# Patient Record
Sex: Female | Born: 1970 | Race: White | Hispanic: No | Marital: Married | Smoking: Never smoker
Health system: Southern US, Community
[De-identification: ages and names within clinical notes are randomized; demographics above are authoritative.]

## PROBLEM LIST (undated history)

## (undated) DIAGNOSIS — I10 Essential (primary) hypertension: Secondary | ICD-10-CM

## (undated) DIAGNOSIS — T753XXA Motion sickness, initial encounter: Secondary | ICD-10-CM

## (undated) DIAGNOSIS — Z973 Presence of spectacles and contact lenses: Secondary | ICD-10-CM

## (undated) DIAGNOSIS — Z9889 Other specified postprocedural states: Secondary | ICD-10-CM

## (undated) DIAGNOSIS — F988 Other specified behavioral and emotional disorders with onset usually occurring in childhood and adolescence: Secondary | ICD-10-CM

## (undated) DIAGNOSIS — E669 Obesity, unspecified: Secondary | ICD-10-CM

## (undated) DIAGNOSIS — G479 Sleep disorder, unspecified: Secondary | ICD-10-CM

## (undated) DIAGNOSIS — E785 Hyperlipidemia, unspecified: Secondary | ICD-10-CM

## (undated) DIAGNOSIS — R112 Nausea with vomiting, unspecified: Secondary | ICD-10-CM

## (undated) HISTORY — DX: Other specified behavioral and emotional disorders with onset usually occurring in childhood and adolescence: F98.8

## (undated) HISTORY — DX: Sleep disorder, unspecified: G47.9

## (undated) HISTORY — DX: Essential (primary) hypertension: I10

## (undated) HISTORY — DX: Hyperlipidemia, unspecified: E78.5

## (undated) HISTORY — DX: Obesity, unspecified: E66.9

---

## 1999-06-15 HISTORY — PX: DILATION AND CURETTAGE OF UTERUS: SHX78

## 2015-01-21 ENCOUNTER — Encounter: Payer: Self-pay | Admitting: *Deleted

## 2015-01-29 ENCOUNTER — Other Ambulatory Visit: Payer: Self-pay | Admitting: *Deleted

## 2015-01-29 ENCOUNTER — Other Ambulatory Visit: Payer: Self-pay | Admitting: Obstetrics and Gynecology

## 2015-01-29 ENCOUNTER — Encounter: Payer: Self-pay | Admitting: Obstetrics and Gynecology

## 2015-01-29 ENCOUNTER — Ambulatory Visit (INDEPENDENT_AMBULATORY_CARE_PROVIDER_SITE_OTHER): Payer: BLUE CROSS/BLUE SHIELD | Admitting: Obstetrics and Gynecology

## 2015-01-29 VITALS — BP 121/89 | HR 70 | Ht 65.0 in | Wt 179.0 lb

## 2015-01-29 DIAGNOSIS — E559 Vitamin D deficiency, unspecified: Secondary | ICD-10-CM

## 2015-01-29 DIAGNOSIS — E785 Hyperlipidemia, unspecified: Secondary | ICD-10-CM

## 2015-01-29 DIAGNOSIS — Z01419 Encounter for gynecological examination (general) (routine) without abnormal findings: Secondary | ICD-10-CM | POA: Diagnosis not present

## 2015-01-29 MED ORDER — VITAMIN D (ERGOCALCIFEROL) 1.25 MG (50000 UNIT) PO CAPS: 50000.0000 [IU] | ORAL_CAPSULE | ORAL | Status: DC

## 2015-01-29 MED ORDER — ALPRAZOLAM 0.5 MG PO TABS
0.5000 mg | ORAL_TABLET | Freq: Three times a day (TID) | ORAL | Status: DC | PRN
Start: 2015-01-29 — End: 2018-08-17

## 2015-01-29 MED ORDER — LISINOPRIL 20 MG PO TABS: 20.0000 mg | ORAL_TABLET | Freq: Every day | ORAL | Status: DC

## 2015-01-29 NOTE — Progress Notes (Signed)
  Subjective:     Carla Oliver is a 44 y.o. female and is here for a comprehensive physical exam. The patient reports no problems.  Social History   Social History  . Marital Status: Married    Spouse Name: N/A  . Number of Children: N/A  . Years of Education: N/A   Occupational History  . Not on file.   Social History Main Topics  . Smoking status: Never Smoker   . Smokeless tobacco: Never Used  . Alcohol Use: Yes  . Drug Use: No  . Sexual Activity: Yes    Birth Control/ Protection: Condom   Other Topics Concern  . Not on file   Social History Narrative   Health Maintenance  Topic Date Due  . HIV Screening  05/29/1984  . PAP SMEAR  05/30/1987  . TETANUS/TDAP  05/29/1988  . INFLUENZA VACCINE  01/13/2015    The following portions of the patient's history were reviewed and updated as appropriate: allergies, current medications, past family history, past medical history, past social history, past surgical history and problem list.  Review of Systems A comprehensive review of systems was negative.   Objective:    General appearance: alert, cooperative, appears stated age and no distress Neck: no adenopathy, no carotid bruit, no JVD, supple, symmetrical, trachea midline and thyroid not enlarged, symmetric, no tenderness/mass/nodules Lungs: clear to auscultation bilaterally Breasts: normal appearance, no masses or tenderness Heart: regular rate and rhythm, S1, S2 normal, no murmur, click, rub or gallop Abdomen: soft, non-tender; bowel sounds normal; no masses,  no organomegaly Pelvic: cervix normal in appearance, external genitalia normal, no adnexal masses or tenderness, no cervical motion tenderness, rectovaginal septum normal, uterus normal size, shape, and consistency and vagina normal without discharge    Assessment:    Healthy female exam. Hypertension; Vitamin d deficiency; hyperlipidemia; overweight; ADD; occasional sleep disturbance      Plan:  Routine pap  obtained with screening labs, MMG ordered; recommended 800mg  Vit E for breast fullness; refilled xanax and Vit D and Lisionopril. RTC 1 year or prn   See After Visit Summary for Counseling Recommendations

## 2015-01-29 NOTE — Patient Instructions (Signed)
  Place annual gynecologic exam patient instructions here.  Thank you for enrolling in [facility]. Please follow the instructions below to securely access your online medical record. MyChart allows you to send messages to your doctor, view your test results, manage appointments, and more.   How Do I Sign Up? 1. In your Internet browser, go to [employer] and enter [email or web address]. 2. Click on the Sign Up Now link in the Sign In box. You will see the New Member Sign Up page. 3. Enter your MyChart Access Code exactly as it appears below. You will not need to use this code after you've completed the sign-up process. If you do not sign up before the expiration date, you must request a new code.  MyChart Access Code: [email or web address] Expires: 03/30/2015  9:16 AM  4. Enter your Social Security Number ([id number]) and Date of Birth (mm/dd/yyyy) as indicated and click Submit. You will be taken to the next sign-up page. 5. Create a MyChart ID. This will be your MyChart login ID and cannot be changed, so think of one that is secure and easy to remember. 6. Create a MyChart password. You can change your password at any time. 7. Enter your Password Reset Question and Answer. This can be used at a later time if you forget your password.  8. Enter your e-mail address. You will receive e-mail notification when new information is available in [facility]. 9. Click Sign Up. You can now view your medical record.   Additional Information Remember, MyChart is NOT to be used for urgent needs. For medical emergencies, dial 911.

## 2015-01-30 ENCOUNTER — Other Ambulatory Visit: Payer: Self-pay | Admitting: Obstetrics and Gynecology

## 2015-01-30 DIAGNOSIS — E782 Mixed hyperlipidemia: Secondary | ICD-10-CM | POA: Insufficient documentation

## 2015-01-30 LAB — COMPREHENSIVE METABOLIC PANEL
A/G RATIO: 1.8 (ref 1.1–2.5)
ALT: 23 IU/L (ref 0–32)
AST: 18 IU/L (ref 0–40)
Albumin: 4.4 g/dL (ref 3.5–5.5)
Alkaline Phosphatase: 57 IU/L (ref 39–117)
BUN/Creatinine Ratio: 11 (ref 9–23)
BUN: 9 mg/dL (ref 6–24)
Bilirubin Total: 0.6 mg/dL (ref 0.0–1.2)
CALCIUM: 9.2 mg/dL (ref 8.7–10.2)
CO2: 23 mmol/L (ref 18–29)
CREATININE: 0.79 mg/dL (ref 0.57–1.00)
Chloride: 100 mmol/L (ref 97–108)
GFR, EST AFRICAN AMERICAN: 105 mL/min/{1.73_m2} (ref >59)
GFR, EST NON AFRICAN AMERICAN: 91 mL/min/{1.73_m2} (ref >59)
GLUCOSE: 99 mg/dL (ref 65–99)
Globulin, Total: 2.4 g/dL (ref 1.5–4.5)
Potassium: 4.2 mmol/L (ref 3.5–5.2)
Sodium: 142 mmol/L (ref 134–144)
TOTAL PROTEIN: 6.8 g/dL (ref 6.0–8.5)

## 2015-01-30 LAB — NMR, LIPOPROFILE
CHOLESTEROL: 243 mg/dL — AB (ref 100–199)
HDL CHOLESTEROL BY NMR: 53 mg/dL (ref >39)
HDL PARTICLE NUMBER: 33.4 umol/L (ref >=30.5)
LDL Particle Number: 1935 nmol/L — ABNORMAL HIGH (ref <1000)
LDL Size: 21.2 nm (ref >20.5)
LDL-C: 152 mg/dL — AB (ref 0–99)
LP-IR Score: 35 (ref <=45)
SMALL LDL PARTICLE NUMBER: 771 nmol/L — AB (ref <=527)
TRIGLYCERIDES BY NMR: 190 mg/dL — AB (ref 0–149)

## 2015-01-30 LAB — VITAMIN D 25 HYDROXY (VIT D DEFICIENCY, FRACTURES): VIT D 25 HYDROXY: 35.7 ng/mL (ref 30.0–100.0)

## 2015-01-30 MED ORDER — GARLIC 1000 MG PO CAPS: 1.0000 | ORAL_CAPSULE | Freq: Every morning | ORAL | Status: DC

## 2015-01-30 MED ORDER — RED YEAST RICE 600 MG PO TABS: 1.0000 | ORAL_TABLET | Freq: Every morning | ORAL | Status: DC

## 2015-01-30 MED ORDER — ATORVASTATIN CALCIUM 20 MG PO TABS: 20.0000 mg | ORAL_TABLET | Freq: Every day | ORAL | Status: DC

## 2015-01-30 MED ORDER — OMEGA-3 1000 MG PO CAPS: 1.0000 | ORAL_CAPSULE | Freq: Every morning | ORAL | Status: DC

## 2015-01-31 LAB — CYTOLOGY - PAP

## 2015-02-03 ENCOUNTER — Telehealth: Payer: Self-pay | Admitting: *Deleted

## 2015-02-03 NOTE — Telephone Encounter (Signed)
Notified pt of results, pt is going to get real serious with diet and exercise, doesn't want to start medication, will repeat labs in 3 months

## 2015-02-03 NOTE — Telephone Encounter (Signed)
-----   Message from Evonnie Pat, [place] sent at 01/30/2015 11:56 AM EDT ----- Please let her know her vit D is just now in normal range- I want her to continue with weekly supplement as we discussed. Also her cholesterol panel was worse, I sent in rx for Lipitor and garlic tablets, red rice yeast tablets, and Omega-3 tablets, to take all 4 daily to lower cholesterol, and want to recheck levels in 3 months.  Also continue to work on lowering cholesterol intake and regular exercise.  Part of her lipidemia is lifestyle but also shows hereditary factors.

## 2015-02-04 ENCOUNTER — Encounter: Payer: Self-pay | Admitting: *Deleted

## 2015-02-18 ENCOUNTER — Telehealth: Payer: Self-pay | Admitting: Obstetrics and Gynecology

## 2015-02-18 NOTE — Telephone Encounter (Signed)
Patient called stating her insurance is denying her lab done for cholesterol as it was not medically necessary. It looked like to me that it was coded correctly so Im not sure what to do.

## 2015-02-19 ENCOUNTER — Encounter: Payer: Self-pay | Admitting: Obstetrics and Gynecology

## 2015-02-19 ENCOUNTER — Ambulatory Visit (INDEPENDENT_AMBULATORY_CARE_PROVIDER_SITE_OTHER): Payer: BLUE CROSS/BLUE SHIELD | Admitting: Obstetrics and Gynecology

## 2015-02-19 VITALS — BP 134/91 | HR 78 | Ht 66.0 in | Wt 178.2 lb

## 2015-02-19 DIAGNOSIS — N764 Abscess of vulva: Secondary | ICD-10-CM

## 2015-02-19 NOTE — Telephone Encounter (Signed)
She will need to appeal it with her insurance, she also can contact [facility] and see if they will discount bill.

## 2015-02-19 NOTE — Progress Notes (Signed)
Subjective:     Patient ID: Carla Oliver, female   DOB: 03-10-1971, 44 y.o.   MRN: [id number]  HPI Felt a small bump on right vulva x 1 week   Review of Systems Painful at times, initially size of a green pea, now smaller- has been doing hot soaks and size has decreased, denies drainage or bleeding from it.    Objective:   Physical Exam A&O x4 Well groomed, no distress Pelvic exam: normal external genitalia, vulva, vagina, cervix, uterus and adnexa, VULVA: vulvar tenderness midline of minora and majora junction where vulvar nodule is located, movable and <70mm, no drainage noted VAGINA: normal appearing vagina with normal color and discharge, no lesions.    Assessment:     Vulvar cyst-right     Plan:     Epsom salt soaks tid until cyst resolved RTC if worsens  Gennie Alma, CNM

## 2015-02-21 NOTE — Telephone Encounter (Signed)
I added a couple dx codes E78.89 and E78.6 to see if it can get paid by insurance.  I notified patient. We will have to wait and see if her insurance will cover it with the added codes.

## 2015-02-26 ENCOUNTER — Ambulatory Visit: Payer: BLUE CROSS/BLUE SHIELD | Admitting: Obstetrics and Gynecology

## 2015-03-06 ENCOUNTER — Other Ambulatory Visit: Payer: BLUE CROSS/BLUE SHIELD

## 2015-03-06 ENCOUNTER — Ambulatory Visit (INDEPENDENT_AMBULATORY_CARE_PROVIDER_SITE_OTHER): Payer: BLUE CROSS/BLUE SHIELD | Admitting: Family Medicine

## 2015-03-06 ENCOUNTER — Encounter: Payer: Self-pay | Admitting: Family Medicine

## 2015-03-06 ENCOUNTER — Other Ambulatory Visit: Payer: Self-pay | Admitting: *Deleted

## 2015-03-06 VITALS — BP 132/86 | HR 103 | Temp 99.2°F | Resp 18 | Ht 66.0 in | Wt 178.0 lb

## 2015-03-06 DIAGNOSIS — F900 Attention-deficit hyperactivity disorder, predominantly inattentive type: Secondary | ICD-10-CM | POA: Diagnosis not present

## 2015-03-06 DIAGNOSIS — Z2839 Other underimmunization status: Secondary | ICD-10-CM

## 2015-03-06 DIAGNOSIS — Z9189 Other specified personal risk factors, not elsewhere classified: Secondary | ICD-10-CM

## 2015-03-06 DIAGNOSIS — Z111 Encounter for screening for respiratory tuberculosis: Secondary | ICD-10-CM

## 2015-03-06 DIAGNOSIS — I10 Essential (primary) hypertension: Secondary | ICD-10-CM | POA: Insufficient documentation

## 2015-03-06 DIAGNOSIS — Z9289 Personal history of other medical treatment: Secondary | ICD-10-CM | POA: Diagnosis not present

## 2015-03-06 DIAGNOSIS — G4700 Insomnia, unspecified: Secondary | ICD-10-CM | POA: Insufficient documentation

## 2015-03-06 DIAGNOSIS — F988 Other specified behavioral and emotional disorders with onset usually occurring in childhood and adolescence: Secondary | ICD-10-CM

## 2015-03-06 DIAGNOSIS — K5901 Slow transit constipation: Secondary | ICD-10-CM | POA: Insufficient documentation

## 2015-03-06 DIAGNOSIS — F4322 Adjustment disorder with anxiety: Secondary | ICD-10-CM | POA: Insufficient documentation

## 2015-03-06 MED ORDER — LISDEXAMFETAMINE DIMESYLATE 40 MG PO CAPS: 40.0000 mg | ORAL_CAPSULE | ORAL | Status: DC

## 2015-03-06 NOTE — Progress Notes (Signed)
Name: Carla Oliver   MRN: [id number]    DOB: [birth year]   Date:03/06/2015       Progress Note  Subjective  Chief Complaint  Chief Complaint  Patient presents with  . Medication Refill    vyvanse  . Ear Problem    red and hot    HPI  Attention Deficit Hyperactivity Disorder follow up: Carla Oliver is here for routine follow up of the diagnosis of ADD/ADHD. The condition is characterized as fidgeting, loses items necessary for activity, interrupting others, poor attention span, shifting from one uncompleted task to another and easily distracted but not excessive talking, difficulty remaining seated, difficulty waiting for their turn, engages in physically dangerous activities, difficulty waiting for their turn, blurting out answers before question is complete, difficulty following instructions or antisocial behavior. There has been no associated hearing difficulties, vision disturbances, difficulty speaking or sleeping. The patient currently uses Vyvanse 40 mg daily (took a break over the Summer) and reports no side effects from this medication including chest pain, worsening headaches, suppressed appetite and continued weight loss, mood changes, sleep changes.  She currently works as a Glass blower/designer at [employer] but needs form filled out to work at another public facility. She needs a PPD and does not have her immunization records with her.  Active Ambulatory Problems    Diagnosis Date Noted  . Vitamin D deficiency 01/29/2015  . Hyperlipemia, mixed 01/30/2015  . Attention deficit disorder of adult 03/06/2015  . Hypertension goal BP (blood pressure) < 140/90 03/06/2015  . Adjustment disorder with anxious mood 03/06/2015  . Insomnia 03/06/2015  . Constipation, slow transit 03/06/2015  . Incomplete immunization status 03/06/2015   Resolved Ambulatory Problems    Diagnosis Date Noted  . No Resolved Ambulatory Problems   Past Medical History  Diagnosis Date  . Hypertension   . Obesity   .  ADD (attention deficit disorder)   . Hyperlipidemia   . Sleep disturbance     Social History  Substance Use Topics  . Smoking status: Never Smoker   . Smokeless tobacco: Never Used  . Alcohol Use: Yes     Current outpatient prescriptions:  .  lisinopril (PRINIVIL,ZESTRIL) 20 MG tablet, Take 1 tablet (20 mg total) by mouth daily., Disp: 90 tablet, Rfl: 4 .  VYVANSE 40 MG capsule, Take 1 capsule by mouth daily., Disp: , Rfl: 0 .  ALPRAZolam (XANAX) 0.5 MG tablet, Take 1 tablet (0.5 mg total) by mouth 3 (three) times daily as needed for anxiety. (Patient not taking: Reported on 03/06/2015), Disp: 40 tablet, Rfl: 1  Past Surgical History  Procedure Laterality Date  . Dilation and curettage of uterus  2001    Family History  Problem Relation Age of Onset  . Cancer Mother     lung- non smoker    No Known Allergies   Review of Systems  CONSTITUTIONAL: No significant weight changes, fever, chills, weakness or fatigue.  CARDIOVASCULAR: No chest pain, chest pressure or chest discomfort. No palpitations or edema.  RESPIRATORY: No shortness of breath, cough or sputum.  GASTROINTESTINAL: No anorexia, nausea, vomiting. No changes in bowel habits. No abdominal pain or blood.  NEUROLOGICAL: No headache, dizziness, syncope, paralysis, ataxia, numbness or tingling in the extremities. No memory changes. No change in bowel or bladder control.  PSYCHIATRIC: No change in mood. No change in sleep pattern.    Objective  BP 132/86 mmHg  Pulse 103  Temp(Src) 99.2 F (37.3 C) (Oral)  Resp 18  Ht 5'  6" (1.676 m)  Wt 178 lb (80.74 kg)  BMI 28.74 kg/m2  SpO2 97%  LMP 02/09/2015 (Exact Date) Body mass index is 28.74 kg/(m^2).   Physical Exam  Constitutional: Patient appears well-developed and well-nourished. In no distress.  HEENT:  - Head: Normocephalic and atraumatic.  - Ears: Bilateral TMs gray, no erythema or effusion - Nose: Nasal mucosa moist - Mouth/Throat: Oropharynx is clear  and moist. No tonsillar hypertrophy or erythema. No post nasal drainage.  - Eyes: Conjunctivae clear, EOM movements normal. PERRLA. No scleral icterus.  Neck: Normal range of motion. Neck supple. No JVD present. No thyromegaly present.  Cardiovascular: Normal rate, regular rhythm and normal heart sounds.  No murmur heard.  Pulmonary/Chest: Effort normal and breath sounds normal. No respiratory distress. Abdomen: Soft, non tender, non distended, normal bowel sounds in all four quadrants.  Neurological: CN II-XII grossly intact with no focal deficits. Alert and oriented to person, place, and time. Coordination, balance, strength, speech and gait are normal.  Psychiatric: Patient has a stable mood and affect. Behavior is normal in office today. Judgment and thought content normal in office today.    Assessment & Plan  1. Attention deficit disorder of adult Doing well on current medication dose and frequency. No identified side effects or intolerances on today's exam. Continue current regimen as discussed and follow up routinely. Clinically stable findings based on clinical exam and on review of any pertinent results. Recommended to patient that they continue their current regimen with regular follow ups. May call in 3 months for 3 refills and follow up 6 months.  - lisdexamfetamine (VYVANSE) 40 MG capsule; Take 1 capsule (40 mg total) by mouth every morning.  Dispense: 40 capsule; Refill: 0 - lisdexamfetamine (VYVANSE) 40 MG capsule; Take 1 capsule (40 mg total) by mouth every morning.  Dispense: 30 capsule; Refill: 0 - lisdexamfetamine (VYVANSE) 40 MG capsule; Take 1 capsule (40 mg total) by mouth every morning.  Dispense: 30 capsule; Refill: 0  2. Incomplete immunization status Will check titer levels.  - Measles ([place]) Antibody IgG - Rubella Antibody, IgM - Mumps antibody, IgG - Varicella zoster antibody, IgG - Tetanus Antibody, IGG - Hepatitis B surface antibody

## 2015-03-10 LAB — QUANTIFERON IN TUBE
QUANTIFERON MITOGEN VALUE: 8.46 [IU]/mL
QUANTIFERON NIL VALUE: 0.18 [IU]/mL
QUANTIFERON TB AG VALUE: 0.16 IU/mL
QUANTIFERON TB GOLD: NEGATIVE

## 2015-03-10 LAB — QUANTIFERON TB GOLD ASSAY (BLOOD)

## 2015-04-08 ENCOUNTER — Other Ambulatory Visit: Payer: Self-pay | Admitting: Obstetrics and Gynecology

## 2015-08-22 ENCOUNTER — Encounter: Payer: Self-pay | Admitting: Emergency Medicine

## 2015-08-22 ENCOUNTER — Ambulatory Visit
Admission: EM | Admit: 2015-08-22 | Discharge: 2015-08-22 | Disposition: A | Discharge status: Home / Self Care | Payer: BLUE CROSS/BLUE SHIELD | Attending: Family Medicine | Admitting: Family Medicine

## 2015-08-22 DIAGNOSIS — J0191 Acute recurrent sinusitis, unspecified: Secondary | ICD-10-CM | POA: Diagnosis not present

## 2015-08-22 DIAGNOSIS — B349 Viral infection, unspecified: Secondary | ICD-10-CM

## 2015-08-22 LAB — RAPID INFLUENZA A&B ANTIGENS (ARMC ONLY): INFLUENZA A (ARMC): NEGATIVE

## 2015-08-22 LAB — RAPID STREP SCREEN (MED CTR MEBANE ONLY): STREPTOCOCCUS, GROUP A SCREEN (DIRECT): NEGATIVE

## 2015-08-22 LAB — RAPID INFLUENZA A&B ANTIGENS: Influenza B (ARMC): NEGATIVE

## 2015-08-22 MED ORDER — GUAIFENESIN-CODEINE 100-10 MG/5ML PO SOLN: 10.0000 mL | Freq: Every evening | ORAL | Status: DC | PRN

## 2015-08-22 MED ORDER — AMOXICILLIN-POT CLAVULANATE 875-125 MG PO TABS: 1.0000 | ORAL_TABLET | Freq: Two times a day (BID) | ORAL | Status: DC

## 2015-08-22 MED ORDER — OSELTAMIVIR PHOSPHATE 75 MG PO CAPS: 75.0000 mg | ORAL_CAPSULE | Freq: Two times a day (BID) | ORAL | Status: DC

## 2015-08-22 NOTE — ED Notes (Signed)
Patient c/o cough that has gotten worse since yesterday.  Patient reports cold symptoms off and on for a month.

## 2015-08-22 NOTE — ED Provider Notes (Signed)
[facility]  ____________________________________________  Time seen: Approximately 3:57 PM  I have reviewed the triage vital signs and the nursing notes.   HISTORY  Chief Complaint Cough  HPI [name] is a 45 y.o. female presents with a complaint of intermittent runny nose, nasal congestion and cough over the last several weeks. Patient reports that she has been treated for strep throat and sinus infections in the last couple weeks and states that things have gotten better but never fully resolved. Patient reports in the last 2 days her symptoms of cough, congestion have increased as well as she started to have some chills and body aches. Denies known fever, but states shes sure she had a low grade fever. States frequently getting thick greenish nasal drainage with blowing her nose. Also complains of sinus pressure and feels like her sinuses are clogged up.  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 Denies home sick contacts. Patient reports generalized body aches at 5 out of 10 aching.  Denies chest pain, shortness of breath, abdominal pain, neck pain, back pain, dysuria, vision changes, weakness or dizziness.  Patient's last menstrual period was 07/27/2015 (exact date). Denies chance of pregnancy.  Past Medical History  Diagnosis Date  . Hypertension   . Obesity   . ADD (attention deficit disorder)   . Hyperlipidemia   . Sleep disturbance     Patient Active Problem List   Diagnosis Date Noted  . Attention deficit disorder of adult 03/06/2015  . Hypertension goal BP (blood pressure) < 140/90 03/06/2015  . Adjustment disorder with anxious mood 03/06/2015  . Insomnia 03/06/2015  . Constipation, slow transit 03/06/2015  . Incomplete immunization status 03/06/2015  . Hyperlipemia, mixed 01/30/2015  . Vitamin D deficiency 01/29/2015    Past Surgical History  Procedure Laterality Date   . Dilation and curettage of uterus  2001    Current Outpatient Rx  Name  Route  Sig  Dispense  Refill  . ALPRAZolam (XANAX) 0.5 MG tablet   Oral   Take 1 tablet (0.5 mg total) by mouth 3 (three) times daily as needed for anxiety. Patient not taking: Reported on 03/06/2015   40 tablet   1   . lisdexamfetamine (VYVANSE) 40 MG capsule   Oral   Take 1 capsule (40 mg total) by mouth every morning.   40 capsule   0     Refill 04/05/15   . lisdexamfetamine (VYVANSE) 40 MG capsule   Oral   Take 1 capsule (40 mg total) by mouth every morning.   30 capsule   0     Refill 05/06/15   . lisdexamfetamine (VYVANSE) 40 MG capsule   Oral   Take 1 capsule (40 mg total) by mouth every morning.   30 capsule   0     Refill 03/06/15   . lisinopril (PRINIVIL,ZESTRIL) 20 MG tablet      TAKE 1 TABLET BY MOUTH DAILY   90 tablet   0   . VYVANSE 40 MG capsule   Oral   Take 1 capsule by mouth daily.      0     Dispense as written.     Allergies Review of patient's allergies indicates no known allergies.  Family History  Problem Relation Age of Onset  . Cancer Mother     lung- non smoker    Social History Social History  Substance Use Topics  .  Smoking status: Never Smoker   . Smokeless tobacco: Never Used  . Alcohol Use: Yes    Review of Systems Constitutional:Positive chills. Eyes: No visual changes. Ears, nose and throat : positive cough congestion nasal pressure. Positive mild sore throat.  Cardiovascular: Denies chest pain. Respiratory: Denies shortness of breath. Gastrointestinal: No abdominal pain.  No nausea, no vomiting.  No diarrhea.  No constipation. Genitourinary: Negative for dysuria. Musculoskeletal: Negative for back pain. Skin: Negative for rash. Neurological: Negative for headaches, focal weakness or numbness.  10-point ROS otherwise negative.  ____________________________________________   PHYSICAL EXAM:  VITAL SIGNS: ED Triage Vitals  Enc  Vitals Group     BP 08/22/15 1419 138/91 mmHg     Pulse Rate 08/22/15 1419 83     Resp 08/22/15 1419 16     Temp 08/22/15 1419 99.5 F (37.5 C)     Temp Source 08/22/15 1419 Tympanic     SpO2 08/22/15 1419 100 %     Weight 08/22/15 1419 175 lb (79.379 kg)     Height 08/22/15 1419 5\' 6"  (1.676 m)     Head Cir --      Peak Flow --      Pain Score 08/22/15 1422 5     Pain Loc --      Pain Edu? --      Excl. in [place]? --   Constitutional: Alert and oriented. Well appearing and in no acute distress. Eyes: Conjunctivae are normal. PERRL. EOMI. Head: Atraumatic.Mild to moderate tenderness to palpation bilateral frontal and maxillary sinuses. No swelling. No erythema.   Ears: no erythema, normal TMs bilaterally.   Nose: nasal congestion with bilateral nasal turbinate erythema and edema.   Mouth/Throat: Mucous membranes are moist.  Oropharynx non-erythematous.No tonsillar swelling or exudate.  Neck: No stridor.  No cervical spine tenderness to palpation. Hematological/Lymphatic/Immunilogical: No cervical lymphadenopathy. Cardiovascular: Normal rate, regular rhythm. Grossly normal heart sounds.  Good peripheral circulation. Respiratory: Normal respiratory effort.  No retractions. Lungs CTAB. No wheezes, rales or rhonchi. Good air movement.  Gastrointestinal: Soft and nontender. No distention. Normal Bowel sounds. No CVA tenderness. Musculoskeletal: No lower or upper extremity tenderness nor edema.  Bilateral pedal pulses equal and easily palpated. No cervical, thoracic or lumbar tenderness to palpation.  Neurologic:  Normal speech and language. No gross focal neurologic deficits are appreciated. No gait instability. Skin:  Skin is warm, dry and intact. No rash noted. Psychiatric: Mood and affect are normal. Speech and behavior are normal.  ____________________________________________   LABS (all labs ordered are listed, but only abnormal results are displayed)  Labs Reviewed  RAPID INFLUENZA  A&B ANTIGENS ([facility] ONLY)  RAPID STREP SCREEN (NOT AT [facility])  CULTURE, GROUP A STREP [facility])     INITIAL IMPRESSION / ASSESSMENT AND PLAN / ED COURSE  Pertinent labs & imaging results that were available during my care of the patient were reviewed by me and considered in my medical decision making (see chart for details). Well-appearing patient. No acute distress. Reports continues to eat and drink well. Presenting for the complaints of several weeks of intermittent runny nose, nasal congestion, sinus pressure and cough. Patient reports that she was treated with penicillin for strep throat approximately two months ago. Reports that her sinus congestion and sinus drainage has never fully resolved. Patient with continued sinus tenderness. Dry intermittent cough. Lungs clear throughout. Abdomen soft and nontender. Moist mucous membranes. Suspect sinusitis. 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 Patient symptoms  as mentioned has been going on for several weeks but with acute change in the last 2 days with accompanying body aches and report of fever.   Suspect continued sinusitis with postnasal drainage however with acute change in the last 2 days with positive influenza exposures, suspect influenza. Influenza negative. Strep negative, will culture. However based on clinical appearance as well as positive exposure suspect influenza. Discussed with patient treatment with oral Tamiflu. Patient requests prescription for Tamiflu.Discussed indication, risks and benefits of medications with patient. Will treat sinusitis with oral augmentin, prn guaifenesin with codeine. Encouraged rest, fluids, otc ibuprofen or tylenol as needed.   Discussed follow up with Primary care physician this week. Discussed follow up and return parameters including no resolution or any worsening concerns. Patient verbalized understanding and agreed to plan.    ____________________________________________   FINAL CLINICAL IMPRESSION(S) / ED DIAGNOSES  Final diagnoses:  Acute recurrent sinusitis, unspecified location  Viral illness      Note: This dictation was prepared with Dragon dictation along with smaller phrase technology. Any transcriptional errors that result from this process are unintentional.    Marylene Land, NP 08/22/15 1713

## 2015-08-22 NOTE — Discharge Instructions (Signed)
Take medication as prescribed. Rest. Drink plenty of fluids.   Follow up with your primary care physician this week as needed. Return to Urgent care for new or worsening concerns.    Sinusitis, Adult Sinusitis is redness, soreness, and inflammation of the paranasal sinuses. Paranasal sinuses are air pockets within the bones of your face. They are located beneath your eyes, in the middle of your forehead, and above your eyes. In healthy paranasal sinuses, mucus is able to drain out, and air is able to circulate through them by way of your nose. However, when your paranasal sinuses are inflamed, mucus and air can become trapped. This can allow bacteria and other germs to grow and cause infection. Sinusitis can develop quickly and last only a short time (acute) or continue over a long period (chronic). Sinusitis that lasts for more than 12 weeks is considered chronic. CAUSES Causes of sinusitis include:  Allergies.  Structural abnormalities, such as displacement of the cartilage that separates your nostrils (deviated septum), which can decrease the air flow through your nose and sinuses and affect sinus drainage.  Functional abnormalities, such as when the small hairs (cilia) that line your sinuses and help remove mucus do not work properly or are not present. SIGNS AND SYMPTOMS Symptoms of acute and chronic sinusitis are the same. The primary symptoms are pain and pressure around the affected sinuses. Other symptoms include:  Upper toothache.  Earache.  Headache.  Bad breath.  Decreased sense of smell and taste.  A cough, which worsens when you are lying flat.  Fatigue.  Fever.  Thick drainage from your nose, which often is green and may contain pus (purulent).  Swelling and warmth over the affected sinuses. DIAGNOSIS Your health care provider will perform a physical exam. During your exam, your health care provider may perform any of the following to help determine if you have  acute sinusitis or chronic sinusitis:  Look in your nose for signs of abnormal growths in your nostrils (nasal polyps).  Tap over the affected sinus to check for signs of infection.  View the inside of your sinuses using an imaging device that has a light attached (endoscope). If your health care provider suspects that you have chronic sinusitis, one or more of the following tests may be recommended:  Allergy tests.  Nasal culture. A sample of mucus is taken from your nose, sent to a lab, and screened for bacteria.  Nasal cytology. A sample of mucus is taken from your nose and examined by your health care provider to determine if your sinusitis is related to an allergy. TREATMENT Most cases of acute sinusitis are related to a viral infection and will resolve on their own within 10 days. Sometimes, medicines are prescribed to help relieve symptoms of both acute and chronic sinusitis. These may include pain medicines, decongestants, nasal steroid sprays, or saline sprays. However, for sinusitis related to a bacterial infection, your health care provider will prescribe antibiotic medicines. These are medicines that will help kill the bacteria causing the infection. Rarely, sinusitis is caused by a fungal infection. In these cases, your health care provider will prescribe antifungal medicine. For some cases of chronic sinusitis, surgery is needed. Generally, these are cases in which sinusitis recurs more than 3 times per year, despite other treatments. HOME CARE INSTRUCTIONS  Drink plenty of water. Water helps thin the mucus so your sinuses can drain more easily.  Use a humidifier.  Inhale steam 3-4 times a day (for example, sit in  the bathroom with the shower running).  Apply a warm, moist washcloth to your face 3-4 times a day, or as directed by your health care provider.  Use saline nasal sprays to help moisten and clean your sinuses.  Take medicines only as directed by your health care  provider.  If you were prescribed either an antibiotic or antifungal medicine, finish it all even if you start to feel better. SEEK IMMEDIATE MEDICAL CARE IF:  You have increasing pain or severe headaches.  You have nausea, vomiting, or drowsiness.  You have swelling around your face.  You have vision problems.  You have a stiff neck.  You have difficulty breathing.   This information is not intended to replace advice given to you by your health care provider. Make sure you discuss any questions you have with your health care provider.   Document Released: 05/31/2005 Document Revised: 06/21/2014 Document Reviewed: 06/15/2011 Elsevier Interactive Patient Education [employer].

## 2015-08-24 LAB — CULTURE, GROUP A STREP (THRC)

## 2015-08-25 ENCOUNTER — Telehealth: Payer: Self-pay

## 2015-08-25 NOTE — Telephone Encounter (Signed)
Agree with recommendation

## 2015-08-25 NOTE — Telephone Encounter (Signed)
FYI patient called states she went to urgent care on Friday was diagnosed with flu and sinusitis and was given tamiflu and antibiotic.  She was told to f/u with PCP.  She called today @ 3:00 stating she was now having some wheezing and congestion and wanted to be seen.  I told her you were booked for the rest of the day and we had already worked in someone.  I told her most likely we would do nothing different since she was currently on an antibioic and tamiflu already.  She said has never had pneumonia and wanted to know if she needed an inhaler.  I told her I could not guarantee the doctor would do anything different and to keep her appointment on Wednesday with Dr. Ancil Boozer , if she felt worse or felt she needed to be seen urgently to go back to urgent care or ER.  She asked if you had an e-mail and I told her she could e-mail you through my chart.  So you may receive an e-mail.

## 2015-08-27 ENCOUNTER — Encounter: Payer: Self-pay | Admitting: Family Medicine

## 2015-08-27 ENCOUNTER — Ambulatory Visit (INDEPENDENT_AMBULATORY_CARE_PROVIDER_SITE_OTHER): Payer: BLUE CROSS/BLUE SHIELD | Admitting: Family Medicine

## 2015-08-27 VITALS — BP 134/82 | HR 88 | Temp 98.9°F | Resp 18 | Ht 66.0 in | Wt 180.4 lb

## 2015-08-27 DIAGNOSIS — F900 Attention-deficit hyperactivity disorder, predominantly inattentive type: Secondary | ICD-10-CM

## 2015-08-27 DIAGNOSIS — J0101 Acute recurrent maxillary sinusitis: Secondary | ICD-10-CM | POA: Diagnosis not present

## 2015-08-27 DIAGNOSIS — F988 Other specified behavioral and emotional disorders with onset usually occurring in childhood and adolescence: Secondary | ICD-10-CM

## 2015-08-27 DIAGNOSIS — F458 Other somatoform disorders: Secondary | ICD-10-CM

## 2015-08-27 DIAGNOSIS — R0989 Other specified symptoms and signs involving the circulatory and respiratory systems: Secondary | ICD-10-CM

## 2015-08-27 DIAGNOSIS — R062 Wheezing: Secondary | ICD-10-CM

## 2015-08-27 DIAGNOSIS — B349 Viral infection, unspecified: Secondary | ICD-10-CM | POA: Diagnosis not present

## 2015-08-27 MED ORDER — LISDEXAMFETAMINE DIMESYLATE 40 MG PO CAPS: 40.0000 mg | ORAL_CAPSULE | ORAL | Status: DC

## 2015-08-27 MED ORDER — CODEINE POLT-CHLORPHEN POLT ER 14.7-2.8 MG/5ML PO SUER: 5.0000 mL | Freq: Two times a day (BID) | ORAL | Status: DC

## 2015-08-27 MED ORDER — ALBUTEROL SULFATE HFA 108 (90 BASE) MCG/ACT IN AERS: 2.0000 | INHALATION_SPRAY | Freq: Four times a day (QID) | RESPIRATORY_TRACT | Status: DC | PRN

## 2015-08-27 NOTE — Progress Notes (Signed)
Name: Carla Oliver   MRN: [id number]    DOB: May 09, 1971   Date:08/27/2015       Progress Note  Subjective  Chief Complaint  Chief Complaint  Patient presents with  . URI    Onset-Intermittent since January, Dr. Nadine Counts checked patient for Strep and was positive given Pencillin, got better and 2 weeks later came back. Then went to [facility] on Friday 08/22/15 tested for Flu was negative, but was given Tamiflu due to symptoms and Augmentin. Patient states she is feeling slightly better but achy headache, sinus pressure and painful around her eye sockets, bilateral-right ear is worst pain, dry cough, feels like she has a lump in her throat, and can't swallow.     HPI  Globus sensation and sinus pressure: recurrent sore throat, sinus pressure since January. Went on Urgent care twice, initially have Pen-V-K for strep and this past time diagnosed with sinusitis and flu like illness, current on Augmentin and Tamiflu. Sinus pressure has improve, no longer has fever, appetite is back to normal, she has some intermittent nausea. Wheezing yesterday, still has a cough ( did not get rx for cough medication filled because it is out of stock at her pharmacy ) and some SOB with activity.   Globus: started a couple months ago, she states pills gets stuck at times, also has this fullness sensation on her upper esophagus and throat, constant but with periods of exacerbation, no heartburn, no regurgitation. No family history of esophageal or throat cancer, no personal smoking history  ADD: diagnosed by counselor during her college years ( 28's) son also has ADD. She was on Ritalin int he past, but doing well now on Vyvanse and needs refills. No side effects of medication. She is able to focus and stays on tract while on medication   Patient Active Problem List   Diagnosis Date Noted  . Attention deficit disorder of adult 03/06/2015  . Hypertension goal BP (blood pressure) < 140/90 03/06/2015  .  Adjustment disorder with anxious mood 03/06/2015  . Insomnia 03/06/2015  . Constipation, slow transit 03/06/2015  . Incomplete immunization status 03/06/2015  . Hyperlipemia, mixed 01/30/2015  . Vitamin D deficiency 01/29/2015    Past Surgical History  Procedure Laterality Date  . Dilation and curettage of uterus  2001    Family History  Problem Relation Age of Onset  . Cancer Mother     lung- non smoker    Social History   Social History  . Marital Status: Married    Spouse Name: N/A  . Number of Children: N/A  . Years of Education: N/A   Occupational History  . Not on file.   Social History Main Topics  . Smoking status: Never Smoker   . Smokeless tobacco: Never Used  . Alcohol Use: Yes  . Drug Use: No  . Sexual Activity: Yes    Birth Control/ Protection: Condom   Other Topics Concern  . Not on file   Social History Narrative     Current outpatient prescriptions:  .  ALPRAZolam (XANAX) 0.5 MG tablet, Take 1 tablet (0.5 mg total) by mouth 3 (three) times daily as needed for anxiety., Disp: 40 tablet, Rfl: 1 .  amoxicillin-clavulanate (AUGMENTIN) 875-125 MG tablet, Take 1 tablet by mouth every 12 (twelve) hours., Disp: 20 tablet, Rfl: 0 .  lisdexamfetamine (VYVANSE) 40 MG capsule, Take 1 capsule (40 mg total) by mouth every morning., Disp: 40 capsule, Rfl: 0 .  lisdexamfetamine (VYVANSE) 40 MG capsule,  Take 1 capsule (40 mg total) by mouth every morning., Disp: 30 capsule, Rfl: 0 .  lisdexamfetamine (VYVANSE) 40 MG capsule, Take 1 capsule (40 mg total) by mouth every morning., Disp: 30 capsule, Rfl: 0 .  lisinopril (PRINIVIL,ZESTRIL) 20 MG tablet, TAKE 1 TABLET BY MOUTH DAILY, Disp: 90 tablet, Rfl: 0 .  oseltamivir (TAMIFLU) 75 MG capsule, Take 1 capsule (75 mg total) by mouth every 12 (twelve) hours., Disp: 10 capsule, Rfl: 0 .  TAMIFLU 75 MG capsule, , Disp: , Rfl: 0 .  Vitamin D, Ergocalciferol, (DRISDOL) 50000 units CAPS capsule, TK 1 C PO ONCE A WEEK, Disp:  , Rfl: 2 .  VYVANSE 40 MG capsule, Take 1 capsule by mouth daily., Disp: , Rfl: 0 .  albuterol (PROVENTIL HFA;VENTOLIN HFA) 108 (90 Base) MCG/ACT inhaler, Inhale 2 puffs into the lungs every 6 (six) hours as needed for wheezing or shortness of breath., Disp: 1 Inhaler, Rfl: 0 .  Codeine Polt-Chlorphen Polt ER (TUZISTRA XR) 14.7-2.8 MG/5ML SUER, Take 5 mLs by mouth 2 (two) times daily., Disp: 473 mL, Rfl: 0  No Known Allergies   ROS  Ten systems reviewed and is negative except as mentioned in HPI   Objective  Filed Vitals:   08/27/15 1223  BP: 134/82  Pulse: 88  Temp: 98.9 F (37.2 C)  TempSrc: Oral  Resp: 18  Height: 5\' 6"  (1.676 m)  Weight: 180 lb 6.4 oz (81.829 kg)  SpO2: 98%    Body mass index is 29.13 kg/(m^2).  Physical Exam  Constitutional: Patient appears well-developed and well-nourished. No distress.  HEENT: head atraumatic, normocephalic, pupils equal and reactive to light,, neck supple, throat within normal limits Cardiovascular: Normal rate, regular rhythm and normal heart sounds.  No murmur heard. No BLE edema. Pulmonary/Chest: Effort normal and breath sounds normal. No respiratory distress. Abdominal: Soft.  There is no tenderness. Psychiatric: Patient has a normal mood and affect. behavior is normal. Judgment and thought content normal.  Recent Results (from the past 2160 hour(s))  Rapid Influenza A&B Antigens ([facility] only)     Status: None   Collection Time: 08/22/15  3:31 PM  Result Value Ref Range   Influenza A ([facility]) NEGATIVE NEGATIVE   Influenza B ([facility]) NEGATIVE NEGATIVE  Rapid strep screen     Status: None   Collection Time: 08/22/15  3:31 PM  Result Value Ref Range   Streptococcus, Group A Screen (Direct) NEGATIVE NEGATIVE    Comment: (NOTE) A Rapid Antigen test may result negative if the antigen level in the sample is below the detection level of this test. The FDA has not cleared this test as a stand-alone test therefore the rapid  antigen negative result has reflexed to a Group A Strep culture.   Culture, group A strep     Status: None   Collection Time: 08/22/15  3:31 PM  Result Value Ref Range   Specimen Description THROAT    Special Requests NONE Reflexed from [id number]    Culture NO BETA HEMOLYTIC STREPTOCOCCI ISOLATED    Report Status 08/24/2015 FINAL     PHQ2/9: Depression screen [facility] 2/9 08/27/2015 03/06/2015  Decreased Interest 0 0  Down, Depressed, Hopeless 0 0  PHQ - 2 Score 0 0     Fall Risk: Fall Risk  08/27/2015 03/06/2015  Falls in the past year? No No      Functional Status Survey: Is the patient deaf or have difficulty hearing?: No Does the patient have difficulty seeing, even when wearing  glasses/contacts?: No Does the patient have difficulty concentrating, remembering, or making decisions?: No Does the patient have difficulty walking or climbing stairs?: No Does the patient have difficulty dressing or bathing?: No Does the patient have difficulty doing errands alone such as visiting a doctor's office or shopping?: No    Assessment & Plan  1. Globus sensation  - Ambulatory referral to ENT  2. Recurrent maxillary sinusitis, unspecified chronicity  - Ambulatory referral to ENT  3. Wheezing  - albuterol (PROVENTIL HFA;VENTOLIN HFA) 108 (90 Base) MCG/ACT inhaler; Inhale 2 puffs into the lungs every 6 (six) hours as needed for wheezing or shortness of breath.  Dispense: 1 Inhaler; Refill: 0  4. Viral illness  - Codeine Polt-Chlorphen Polt ER (TUZISTRA XR) 14.7-2.8 MG/5ML SUER; Take 5 mLs by mouth 2 (two) times daily.  Dispense: 473 mL; Refill: 0   5. Attention deficit disorder of adult  - lisdexamfetamine (VYVANSE) 40 MG capsule; Take 1 capsule (40 mg total) by mouth every morning.  Dispense: 30 capsule; Refill: 0 - lisdexamfetamine (VYVANSE) 40 MG capsule; Take 1 capsule (40 mg total) by mouth every morning.  Dispense: 40 capsule; Refill: 0 - lisdexamfetamine (VYVANSE) 40 MG  capsule; Take 1 capsule (40 mg total) by mouth every morning.  Dispense: 30 capsule; Refill: 0

## 2016-02-03 ENCOUNTER — Encounter: Payer: BLUE CROSS/BLUE SHIELD | Admitting: Obstetrics and Gynecology

## 2016-02-12 ENCOUNTER — Encounter: Payer: BLUE CROSS/BLUE SHIELD | Admitting: Obstetrics and Gynecology

## 2016-04-09 ENCOUNTER — Other Ambulatory Visit: Payer: Self-pay | Admitting: Obstetrics and Gynecology

## 2016-05-05 ENCOUNTER — Encounter: Payer: Self-pay | Admitting: Obstetrics and Gynecology

## 2016-05-05 ENCOUNTER — Ambulatory Visit (INDEPENDENT_AMBULATORY_CARE_PROVIDER_SITE_OTHER): Payer: BLUE CROSS/BLUE SHIELD | Admitting: Obstetrics and Gynecology

## 2016-05-05 VITALS — BP 114/80 | HR 71 | Ht 66.0 in | Wt 178.3 lb

## 2016-05-05 DIAGNOSIS — Z01419 Encounter for gynecological examination (general) (routine) without abnormal findings: Secondary | ICD-10-CM | POA: Diagnosis not present

## 2016-05-05 DIAGNOSIS — F988 Other specified behavioral and emotional disorders with onset usually occurring in childhood and adolescence: Secondary | ICD-10-CM

## 2016-05-05 DIAGNOSIS — E559 Vitamin D deficiency, unspecified: Secondary | ICD-10-CM | POA: Diagnosis not present

## 2016-05-05 DIAGNOSIS — Z23 Encounter for immunization: Secondary | ICD-10-CM

## 2016-05-05 MED ORDER — VITAMIN D (ERGOCALCIFEROL) 1.25 MG (50000 UNIT) PO CAPS: ORAL_CAPSULE | ORAL | 2 refills | Status: DC

## 2016-05-05 MED ORDER — LISDEXAMFETAMINE DIMESYLATE 40 MG PO CAPS: 40.0000 mg | ORAL_CAPSULE | ORAL | 0 refills | Status: DC

## 2016-05-05 MED ORDER — LISINOPRIL 20 MG PO TABS: 20.0000 mg | ORAL_TABLET | Freq: Every day | ORAL | 4 refills | Status: DC

## 2016-05-05 NOTE — Patient Instructions (Signed)
Thank you for enrolling in [place]. Please follow the instructions below to securely access your online medical record. MyChart allows you to send messages to your doctor, view your test results, renew your prescriptions, schedule appointments, and more.  How Do I Sign Up? 1. In your Internet browser, go to [email or web address]. 2. Click on the New  User? link in the Sign In box.  3. Enter your MyChart Access Code exactly as it appears below. You will not need to use this code after you have completed the sign-up process. If you do not sign up before the expiration date, you must request a new code. MyChart Access Code: [id number] Expires: 07/04/2016  8:03 AM  4. Enter the last four digits of your Social Security Number (xxxx) and Date of Birth (mm/dd/yyyy) as indicated and click Next. You will be taken to the next sign-up page. 5. Create a MyChart ID. This will be your MyChart login ID and cannot be changed, so think of one that is secure and easy to remember. 6. Create a MyChart password. You can change your password at any time. 7. Enter your Password Reset Question and Answer and click Next. This can be used at a later time if you forget your password.  8. Select your communication preference, and if applicable enter your e-mail address. You will receive e-mail notification when new information is available in MyChart by choosing to receive e-mail notifications and filling in your e-mail. 9. Click Sign In. You can now view your medical record.   Additional Information If you have questions, you can email [email or web address] or call [phone] to talk to our [place] staff. Remember, MyChart is NOT to be used for urgent needs. For medical emergencies, dial 911.

## 2016-05-05 NOTE — Progress Notes (Signed)
Subjective:   Carla Oliver is a 45 y.o. G67P0010 Caucasian female here for a routine well-woman exam.  Patient's last menstrual period was 04/12/2016.    Current complaints: irregular menses x 6 months occurring every 2 to 6 weeks. Feels mentally foggy, but ran out of Vyvance due to PCP moving PCP: none       does desire labs  Social History: Sexual: heterosexual Marital Status: married Living situation: with family Occupation: unknown occupation Tobacco/alcohol: no tobacco use Illicit drugs: no history of illicit drug use  The following portions of the patient's history were reviewed and updated as appropriate: allergies, current medications, past family history, past medical history, past social history, past surgical history and problem list.  Past Medical History Past Medical History:  Diagnosis Date  . ADD (attention deficit disorder)   . Hyperlipidemia   . Hypertension   . Obesity   . Sleep disturbance     Past Surgical History Past Surgical History:  Procedure Laterality Date  . DILATION AND CURETTAGE OF UTERUS  2001    Gynecologic History G3P0010  Patient's last menstrual period was 04/12/2016. Contraception: condoms Last Pap: 2016. Results were: normal Last mammogram: 2015. Results were: abnormal, with fibroids  Obstetric History OB History  Gravida Para Term Preterm AB Living  3       1    SAB TAB Ectopic Multiple Live Births  1            # Outcome Date GA Lbr Len/2nd Weight Sex Delivery Anes PTL Lv  3 Gravida 2002    F Vag-Spont     2 Gravida 1999    M Vag-Spont     1 SAB               Current Medications Current Outpatient Prescriptions on File Prior to Visit  Medication Sig Dispense Refill  . ALPRAZolam (XANAX) 0.5 MG tablet Take 1 tablet (0.5 mg total) by mouth 3 (three) times daily as needed for anxiety. 40 tablet 1  . lisinopril (PRINIVIL,ZESTRIL) 20 MG tablet TAKE 1 TABLET BY MOUTH ONCE DAILY 90 tablet 1  . Vitamin D, Ergocalciferol,  (DRISDOL) 50000 units CAPS capsule TK 1 C PO ONCE A WEEK  2  . amoxicillin-clavulanate (AUGMENTIN) 875-125 MG tablet Take 1 tablet by mouth every 12 (twelve) hours. (Patient not taking: Reported on 05/05/2016) 20 tablet 0  . Codeine Polt-Chlorphen Polt ER (TUZISTRA XR) 14.7-2.8 MG/5ML SUER Take 5 mLs by mouth 2 (two) times daily. (Patient not taking: Reported on 05/05/2016) 473 mL 0  . lisdexamfetamine (VYVANSE) 40 MG capsule Take 1 capsule (40 mg total) by mouth every morning. (Patient not taking: Reported on 05/05/2016) 30 capsule 0  . lisdexamfetamine (VYVANSE) 40 MG capsule Take 1 capsule (40 mg total) by mouth every morning. (Patient not taking: Reported on 05/05/2016) 40 capsule 0  . lisdexamfetamine (VYVANSE) 40 MG capsule Take 1 capsule (40 mg total) by mouth every morning. (Patient not taking: Reported on 05/05/2016) 30 capsule 0  . oseltamivir (TAMIFLU) 75 MG capsule Take 1 capsule (75 mg total) by mouth every 12 (twelve) hours. (Patient not taking: Reported on 05/05/2016) 10 capsule 0   No current facility-administered medications on file prior to visit.     Review of Systems Patient denies any headaches, blurred vision, shortness of breath, chest pain, abdominal pain, problems with bowel movements, urination, or intercourse.  Objective:  BP 114/80   Pulse 71   Ht 5\' 6"  (1.676 m)   Wt 178  lb 4.8 oz (80.9 kg)   LMP 04/12/2016   BMI 28.78 kg/m  Physical Exam  General:  Well developed, well nourished, no acute distress. She is alert and oriented x3. Skin:  Warm and dry Neck:  Midline trachea, no thyromegaly or nodules Cardiovascular: Regular rate and rhythm, no murmur heard Lungs:  Effort normal, all lung fields clear to auscultation bilaterally Breasts:  No dominant palpable mass, retraction, or nipple discharge Abdomen:  Soft, non tender, no hepatosplenomegaly or masses Pelvic:  External genitalia is normal in appearance.  The vagina is normal in appearance. The cervix is  bulbous, no CMT.  Thin prep pap is not done. Uterus is felt to be normal size, shape, and contour.  No adnexal masses or tenderness noted. Extremities:  No swelling or varicosities noted Psych:  She has a normal mood and affect  Assessment:   Healthy well-woman exam Fibrocystic breast ADD Atypical migraines.  Plan:  Labs obtained, meds refilled Counseled on pre-menopause F/U 1 year for AE, or sooner if needed Mammogram ordered  Melody Rockney Ghee, CNM

## 2016-05-05 NOTE — Addendum Note (Signed)
Addended by: Keturah Barre L on: 05/05/2016 10:51 AM   Modules accepted: Orders

## 2016-05-06 LAB — COMPREHENSIVE METABOLIC PANEL
ALBUMIN: 4.5 g/dL (ref 3.5–5.5)
ALT: 13 IU/L (ref 0–32)
AST: 9 IU/L (ref 0–40)
Albumin/Globulin Ratio: 1.9 (ref 1.2–2.2)
Alkaline Phosphatase: 65 IU/L (ref 39–117)
BILIRUBIN TOTAL: 1.1 mg/dL (ref 0.0–1.2)
BUN / CREAT RATIO: 12 (ref 9–23)
BUN: 10 mg/dL (ref 6–24)
CALCIUM: 9.5 mg/dL (ref 8.7–10.2)
CHLORIDE: 100 mmol/L (ref 96–106)
CO2: 24 mmol/L (ref 18–29)
Creatinine, Ser: 0.81 mg/dL (ref 0.57–1.00)
GFR, EST AFRICAN AMERICAN: 101 mL/min/{1.73_m2} (ref >59)
GFR, EST NON AFRICAN AMERICAN: 87 mL/min/{1.73_m2} (ref >59)
GLUCOSE: 98 mg/dL (ref 65–99)
Globulin, Total: 2.4 g/dL (ref 1.5–4.5)
Potassium: 4.4 mmol/L (ref 3.5–5.2)
Sodium: 140 mmol/L (ref 134–144)
TOTAL PROTEIN: 6.9 g/dL (ref 6.0–8.5)

## 2016-05-06 LAB — CBC
HEMOGLOBIN: 14.2 g/dL (ref 11.1–15.9)
Hematocrit: 41.6 % (ref 34.0–46.6)
MCH: 30 pg (ref 26.6–33.0)
MCHC: 34.1 g/dL (ref 31.5–35.7)
MCV: 88 fL (ref 79–97)
Platelets: 287 10*3/uL (ref 150–379)
RBC: 4.73 x10E6/uL (ref 3.77–5.28)
RDW: 12.8 % (ref 12.3–15.4)
WBC: 7.6 10*3/uL (ref 3.4–10.8)

## 2016-05-06 LAB — LIPID PANEL
CHOL/HDL RATIO: 5 ratio — AB (ref 0.0–4.4)
Cholesterol, Total: 259 mg/dL — ABNORMAL HIGH (ref 100–199)
HDL: 52 mg/dL (ref >39)
LDL CALC: 172 mg/dL — AB (ref 0–99)
Triglycerides: 173 mg/dL — ABNORMAL HIGH (ref 0–149)
VLDL CHOLESTEROL CAL: 35 mg/dL (ref 5–40)

## 2016-05-06 LAB — THYROID PANEL WITH TSH
Free Thyroxine Index: 2 (ref 1.2–4.9)
T3 Uptake Ratio: 28 % (ref 24–39)
T4, Total: 7 ug/dL (ref 4.5–12.0)
TSH: 2.26 u[IU]/mL (ref 0.450–4.500)

## 2016-05-06 LAB — HEMOGLOBIN A1C
ESTIMATED AVERAGE GLUCOSE: 111 mg/dL
HEMOGLOBIN A1C: 5.5 % (ref 4.8–5.6)

## 2016-05-06 LAB — VITAMIN D 25 HYDROXY (VIT D DEFICIENCY, FRACTURES): Vit D, 25-Hydroxy: 38.3 ng/mL (ref 30.0–100.0)

## 2016-05-06 LAB — MAGNESIUM: Magnesium: 2 mg/dL (ref 1.6–2.3)

## 2016-05-08 ENCOUNTER — Other Ambulatory Visit: Payer: Self-pay | Admitting: Obstetrics and Gynecology

## 2016-05-08 MED ORDER — ROSUVASTATIN CALCIUM 10 MG PO TABS: 10.0000 mg | ORAL_TABLET | Freq: Every day | ORAL | 6 refills | Status: DC

## 2016-11-08 ENCOUNTER — Other Ambulatory Visit: Payer: Self-pay | Admitting: Obstetrics and Gynecology

## 2017-02-08 ENCOUNTER — Telehealth: Payer: Self-pay | Admitting: Obstetrics and Gynecology

## 2017-02-08 NOTE — Telephone Encounter (Signed)
pls advise

## 2017-02-08 NOTE — Telephone Encounter (Signed)
Patient needs refill on Vyvanse  [facility] wouldn't send [place] a refill request since its controlled    Please call when ready for pick up

## 2017-02-10 ENCOUNTER — Other Ambulatory Visit: Payer: Self-pay | Admitting: Obstetrics and Gynecology

## 2017-02-10 DIAGNOSIS — F988 Other specified behavioral and emotional disorders with onset usually occurring in childhood and adolescence: Secondary | ICD-10-CM

## 2017-02-10 MED ORDER — LISDEXAMFETAMINE DIMESYLATE 40 MG PO CAPS: 40.0000 mg | ORAL_CAPSULE | ORAL | 0 refills | Status: DC

## 2017-05-06 ENCOUNTER — Other Ambulatory Visit: Payer: Self-pay | Admitting: Obstetrics and Gynecology

## 2017-05-12 ENCOUNTER — Ambulatory Visit: Payer: BLUE CROSS/BLUE SHIELD | Admitting: Obstetrics and Gynecology

## 2017-05-12 ENCOUNTER — Encounter: Payer: Self-pay | Admitting: Obstetrics and Gynecology

## 2017-05-12 VITALS — BP 130/86 | HR 71 | Ht 66.0 in | Wt 176.0 lb

## 2017-05-12 DIAGNOSIS — F988 Other specified behavioral and emotional disorders with onset usually occurring in childhood and adolescence: Secondary | ICD-10-CM

## 2017-05-12 DIAGNOSIS — E7800 Pure hypercholesterolemia, unspecified: Secondary | ICD-10-CM

## 2017-05-12 DIAGNOSIS — Z01419 Encounter for gynecological examination (general) (routine) without abnormal findings: Secondary | ICD-10-CM | POA: Diagnosis not present

## 2017-05-12 MED ORDER — LISINOPRIL 20 MG PO TABS: 20.0000 mg | ORAL_TABLET | Freq: Every day | ORAL | 4 refills | Status: DC

## 2017-05-12 MED ORDER — LISDEXAMFETAMINE DIMESYLATE 40 MG PO CAPS: 40.0000 mg | ORAL_CAPSULE | ORAL | 0 refills | Status: DC

## 2017-05-12 NOTE — Progress Notes (Signed)
Subjective:   Carla Oliver is a 46 y.o. G46P0010 Caucasian female here for a routine well-woman exam.  Patient's last menstrual period was 04/26/2017.    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  Past Medical History Past Medical History:  Diagnosis Date  . ADD (attention deficit disorder)   . Hyperlipidemia   . Hypertension   . Obesity   . Sleep disturbance     Past Surgical History Past Surgical History:  Procedure Laterality Date  . DILATION AND CURETTAGE OF UTERUS  2001    Gynecologic History G3P0010  Patient's last menstrual period was 04/26/2017. Contraception: condoms Last Pap: 2016. Results were: normal Last mammogram: ?. Results were: normal   Obstetric History OB History  Gravida Para Term Preterm AB Living  3       1    SAB TAB Ectopic Multiple Live Births  1            # Outcome Date GA Lbr Len/2nd Weight Sex Delivery Anes PTL Lv  3 Gravida 2002    F Vag-Spont     2 Gravida 1999    M Vag-Spont     1 SAB               Current Medications Current Outpatient Medications on File Prior to Visit  Medication Sig Dispense Refill  . lisinopril (PRINIVIL,ZESTRIL) 20 MG tablet TAKE 1 TABLET BY MOUTH EVERY DAY 90 tablet 0  . Vitamin D, Ergocalciferol, (DRISDOL) 50000 units CAPS capsule TAKE 1 CAPSULE BY MOUTH WEEKLY 30 capsule 3  . ALPRAZolam (XANAX) 0.5 MG tablet Take 1 tablet (0.5 mg total) by mouth 3 (three) times daily as needed for anxiety. (Patient not taking: Reported on 05/12/2017) 40 tablet 1  .  lisdexamfetamine (VYVANSE) 40 MG capsule Take 1 capsule (40 mg total) by mouth every morning. (Patient not taking: Reported on 05/05/2016) 40 capsule 0  . lisdexamfetamine (VYVANSE) 40 MG capsule Take 1 capsule (40 mg total) by mouth every morning. (Patient not taking: Reported on 05/05/2016) 30 capsule 0  . lisdexamfetamine (VYVANSE) 40 MG capsule Take 1 capsule (40 mg total) by mouth every morning. (Patient not taking: Reported on 05/12/2017) 30 capsule 0  . rosuvastatin (CRESTOR) 10 MG tablet Take 1 tablet (10 mg total) by mouth daily. (Patient not taking: Reported on 05/12/2017) 30 tablet 6   No current facility-administered medications on file prior to visit.     Review of Systems Patient denies any headaches, blurred vision, shortness of breath, chest pain, abdominal pain, problems with bowel movements, urination, or intercourse.  Objective:  BP 130/86   Pulse 71   Ht 5\' 6"  (1.676 m)   Wt 176 lb (79.8 kg)   LMP 04/26/2017   BMI 28.41 kg/m  Physical Exam  General:  Well developed, well nourished, no acute distress. She is alert and oriented x3. Skin:  Warm and dry Neck:  Midline trachea, no thyromegaly or nodules Cardiovascular: Regular rate and rhythm, no murmur heard Lungs:  Effort normal, all lung fields clear to auscultation bilaterally Breasts:  No dominant palpable mass, retraction, or  nipple discharge Abdomen:  Soft, non tender, no hepatosplenomegaly or masses Pelvic:  External genitalia is normal in appearance.  The vagina is normal in appearance. The cervix is bulbous, no CMT.  Thin prep pap is not done . Uterus is felt to be normal size, shape, and contour.  No adnexal masses or tenderness noted. Extremities:  No swelling or varicosities noted Psych:  She has a normal mood and affect  Assessment:   Healthy well-woman exam Elevated cholesterol Fibrocystic breast  Plan:  Labs  F/U 1 year for Ae, or sooner if needed Mammogram ordered  Melody Rockney Ghee, CNM

## 2017-05-13 LAB — COMPREHENSIVE METABOLIC PANEL
ALK PHOS: 59 IU/L (ref 39–117)
ALT: 28 IU/L (ref 0–32)
AST: 20 IU/L (ref 0–40)
Albumin/Globulin Ratio: 2 (ref 1.2–2.2)
Albumin: 4.5 g/dL (ref 3.5–5.5)
BUN/Creatinine Ratio: 12 (ref 9–23)
BUN: 11 mg/dL (ref 6–24)
Bilirubin Total: 0.7 mg/dL (ref 0.0–1.2)
CHLORIDE: 102 mmol/L (ref 96–106)
CO2: 24 mmol/L (ref 20–29)
CREATININE: 0.89 mg/dL (ref 0.57–1.00)
Calcium: 9.4 mg/dL (ref 8.7–10.2)
GFR calc Af Amer: 89 mL/min/{1.73_m2} (ref >59)
GFR calc non Af Amer: 77 mL/min/{1.73_m2} (ref >59)
GLOBULIN, TOTAL: 2.3 g/dL (ref 1.5–4.5)
GLUCOSE: 100 mg/dL — AB (ref 65–99)
Potassium: 4.2 mmol/L (ref 3.5–5.2)
SODIUM: 139 mmol/L (ref 134–144)
Total Protein: 6.8 g/dL (ref 6.0–8.5)

## 2017-05-13 LAB — NMR, LIPOPROFILE
Cholesterol: 276 mg/dL — ABNORMAL HIGH (ref 100–199)
HDL CHOLESTEROL BY NMR: 57 mg/dL (ref >39)
HDL Particle Number: 34 umol/L (ref >=30.5)
LDL Particle Number: 2578 nmol/L — ABNORMAL HIGH (ref <1000)
LDL Size: 20.6 nm (ref >20.5)
LDL-C: 185 mg/dL — ABNORMAL HIGH (ref 0–99)
LP-IR SCORE: 55 — AB (ref <=45)
SMALL LDL PARTICLE NUMBER: 1470 nmol/L — AB (ref <=527)
Triglycerides by NMR: 168 mg/dL — ABNORMAL HIGH (ref 0–149)

## 2017-05-13 LAB — HEMOGLOBIN A1C
ESTIMATED AVERAGE GLUCOSE: 111 mg/dL
HEMOGLOBIN A1C: 5.5 % (ref 4.8–5.6)

## 2017-05-13 LAB — TSH: TSH: 2.86 u[IU]/mL (ref 0.450–4.500)

## 2018-05-13 ENCOUNTER — Other Ambulatory Visit: Payer: Self-pay | Admitting: Obstetrics and Gynecology

## 2018-05-18 ENCOUNTER — Encounter: Payer: BLUE CROSS/BLUE SHIELD | Admitting: Obstetrics and Gynecology

## 2018-06-09 ENCOUNTER — Encounter: Payer: BLUE CROSS/BLUE SHIELD | Admitting: Obstetrics and Gynecology

## 2018-07-15 ENCOUNTER — Other Ambulatory Visit: Payer: Self-pay | Admitting: Obstetrics and Gynecology

## 2018-08-17 ENCOUNTER — Ambulatory Visit (INDEPENDENT_AMBULATORY_CARE_PROVIDER_SITE_OTHER): Payer: BLUE CROSS/BLUE SHIELD | Admitting: Obstetrics and Gynecology

## 2018-08-17 ENCOUNTER — Other Ambulatory Visit (HOSPITAL_COMMUNITY)
Admission: RE | Admit: 2018-08-17 | Discharge: 2018-08-17 | Disposition: A | Discharge status: Home / Self Care | Payer: BLUE CROSS/BLUE SHIELD | Source: Ambulatory Visit | Attending: Obstetrics and Gynecology | Admitting: Obstetrics and Gynecology

## 2018-08-17 ENCOUNTER — Encounter: Payer: Self-pay | Admitting: Obstetrics and Gynecology

## 2018-08-17 VITALS — BP 137/96 | HR 72 | Ht 66.0 in | Wt 189.1 lb

## 2018-08-17 DIAGNOSIS — R339 Retention of urine, unspecified: Secondary | ICD-10-CM

## 2018-08-17 DIAGNOSIS — E559 Vitamin D deficiency, unspecified: Secondary | ICD-10-CM

## 2018-08-17 DIAGNOSIS — F988 Other specified behavioral and emotional disorders with onset usually occurring in childhood and adolescence: Secondary | ICD-10-CM

## 2018-08-17 DIAGNOSIS — Z01419 Encounter for gynecological examination (general) (routine) without abnormal findings: Secondary | ICD-10-CM | POA: Insufficient documentation

## 2018-08-17 DIAGNOSIS — E7800 Pure hypercholesterolemia, unspecified: Secondary | ICD-10-CM | POA: Diagnosis not present

## 2018-08-17 DIAGNOSIS — Z01411 Encounter for gynecological examination (general) (routine) with abnormal findings: Secondary | ICD-10-CM

## 2018-08-17 DIAGNOSIS — E669 Obesity, unspecified: Secondary | ICD-10-CM

## 2018-08-17 DIAGNOSIS — N39490 Overflow incontinence: Secondary | ICD-10-CM

## 2018-08-17 MED ORDER — LISDEXAMFETAMINE DIMESYLATE 20 MG PO CAPS: 20.0000 mg | ORAL_CAPSULE | ORAL | 0 refills | Status: DC

## 2018-08-17 MED ORDER — LISINOPRIL 20 MG PO TABS: ORAL_TABLET | ORAL | 4 refills | Status: DC

## 2018-08-17 MED ORDER — ALPRAZOLAM 0.5 MG PO TABS: 0.5000 mg | ORAL_TABLET | Freq: Three times a day (TID) | ORAL | 1 refills | Status: DC | PRN

## 2018-08-17 NOTE — Progress Notes (Signed)
Subjective:   Carla Oliver is a 48 y.o. G63P0010 Caucasian female here for a routine well-woman exam.  Patient's last menstrual period was 07/19/2018.    Current complaints: leaking urine all the time, can't exercise or cough or laugh with leaking large amounts of urine, very embarrassing for her. Doesn't feel like bladder emptys completely when voiding. Just dribbles.tried kegel exercises and kegel balls.  Had 2 normal vaginal deliveries, no pelvic surgeries.  Menses are still normal except flow has picked up. bleeds for 3-4 days and with changing pads every 4-6 hours.  Takes vyvance only as needed, needs refill. But wants to discuss adjustment due to anxiety with 40mg  dose.  Takes xanax to help her sleep at night.   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  Past Medical History Past Medical History:  Diagnosis Date  . ADD (attention deficit disorder)   . Hyperlipidemia   . Hypertension   . Obesity   . Sleep disturbance     Past Surgical History Past Surgical History:  Procedure Laterality Date  . DILATION AND CURETTAGE OF UTERUS  2001    Gynecologic History G3P0010  Patient's last menstrual period was 07/19/2018. Contraception: condoms Last Pap: 2016. Results were: normal Last mammogram: 06/2017. Results were: normal   Obstetric History OB History  Gravida Para Term Preterm AB Living  3       1    SAB TAB Ectopic Multiple Live Births  1            # Outcome Date GA Lbr Len/2nd Weight Sex Delivery Anes PTL Lv  3 Gravida 2002    F Vag-Spont      2 Gravida 1999    M Vag-Spont     1 SAB             Current Medications Current Outpatient Medications on File Prior to Visit  Medication Sig Dispense Refill  . ALPRAZolam (XANAX) 0.5 MG tablet Take 1 tablet (0.5 mg total) by mouth 3 (three) times daily as needed for anxiety. 40 tablet 1  . lisinopril (PRINIVIL,ZESTRIL) 20 MG tablet TAKE 1 TABLET(20 MG) BY MOUTH DAILY 30 tablet 0  . Vitamin D, Ergocalciferol, (DRISDOL) 1.25 MG (50000 UT) CAPS capsule TAKE 1 CAPSULE BY MOUTH WEEKLY 30 capsule 4  . lisdexamfetamine (VYVANSE) 40 MG capsule Take 1 capsule (40 mg total) by mouth every morning. (Patient not taking: Reported on 08/17/2018) 30 capsule 0  . rosuvastatin (CRESTOR) 10 MG tablet Take 1 tablet (10 mg total) by mouth daily. (Patient not taking: Reported on 05/12/2017) 30 tablet 6   No current facility-administered medications on file prior to visit.     Review of Systems Patient denies any headaches, blurred vision, shortness of breath, chest pain, abdominal pain, problems with bowel movements, or intercourse.  Objective:  BP (!) 137/96   Pulse 72   Ht 5\' 6"  (1.676 m)   Wt 189 lb 1.6 oz (85.8 kg)   LMP 07/19/2018   BMI 30.52 kg/m  Physical Exam  General:  Well developed, well nourished, no acute distress. She is alert and oriented x3. Skin:  Warm and dry Neck:  Midline trachea, no thyromegaly or nodules Cardiovascular: Regular rate and rhythm, no murmur heard Lungs:  Effort normal, all lung fields clear to auscultation bilaterally Breasts:  No dominant palpable mass, retraction, or nipple discharge, fibrocystic tissue noted throughout both breast. Abdomen:  Soft, non tender, no hepatosplenomegaly or masses Pelvic:  External genitalia is normal in appearance.  The vagina is normal in appearance. Good kegel muscle tone in lower vagina but no change in pelvic floor muscles at lower bladder section with tightening or bearing down.The cervix is bulbous, no CMT.  Thin prep pap is done  with HR HPV cotesting. Uterus is felt to be normal size, shape, and contour.  No adnexal masses or tenderness noted. Extremities:  No swelling or varicosities noted Psych:  She has a normal mood and affect, slightly anxious.   Assessment:   Healthy well-woman exam ADD BMI 30 Urinary incontinence Incomplete bladder emptying Vit D Deficiency hyperlipidemia  Plan:  Labs obtained in am-will follow up accordingly Recommending pelvic floor PT- referral placed. F/U 1 year for AE, or sooner if needed Mammogram ordered at [facility] Will decrease vyvance to 20mg  and see how that works. Encouraged lower cholesterol in diet.       Rockney Ghee, CNM

## 2018-08-17 NOTE — Patient Instructions (Signed)
Red rice yeast Garlic tablets Fish oil capsules Take all three daily to treat high cholesterol   Urinary Incontinence  Urinary incontinence refers to a condition in which a person is unable to control where and when to pass urine. A person with this condition will urinate when he or she does not mean to (involuntarily). What are the causes? This condition may be caused by:  Medicines.  Infections.  Constipation.  Overactive bladder muscles.  Weak bladder muscles.  Weak pelvic floor muscles. These muscles provide support for the bladder, intestine, and, in women, the uterus.  Enlarged prostate in men. The prostate is a gland near the bladder. When it gets too big, it can pinch the urethra. With the urethra blocked, the bladder can weaken and lose the ability to empty properly.  Surgery.  Emotional factors, such as anxiety, stress, or post-traumatic stress disorder (PTSD).  Pelvic organ prolapse. This happens in women when organs shift out of place and into the vagina. This shift can prevent the bladder and urethra from working properly. What increases the risk? The following factors may make you more likely to develop this condition:  Older age.  Obesity and physical inactivity.  Pregnancy and childbirth.  Menopause.  Diseases that affect the nerves or spinal cord (neurological diseases).  Long-term (chronic) coughing. This can increase pressure on the bladder and pelvic floor muscles. What are the signs or symptoms? Symptoms may vary depending on the type of urinary incontinence you have. They include:  A sudden urge to urinate, but passing urine involuntarily before you can get to a bathroom (urge incontinence).  Suddenly passing urine with any activity that forces urine to pass, such as coughing, laughing, exercise, or sneezing (stress incontinence).  Needing to urinate often, but urinating only a small amount, or constantly dribbling urine (overflow  incontinence).  Urinating because you cannot get to the bathroom in time due to a physical disability, such as arthritis or injury, or communication and thinking problems, such as Alzheimer disease (functional incontinence). How is this diagnosed? This condition may be diagnosed based on:  Your medical history.  A physical exam.  Tests, such as: ? Urine tests. ? X-rays of your kidney and bladder. ? Ultrasound. ? CT scan. ? Cystoscopy. In this procedure, a health care provider inserts a tube with a light and camera (cystoscope) through the urethra and into the bladder in order to check for problems. ? Urodynamic testing. These tests assess how well the bladder, urethra, and sphincter can store and release urine. There are different types of urodynamic tests, and they vary depending on what the test is measuring. To help diagnose your condition, your health care provider may recommend that you keep a log of when you urinate and how much you urinate. How is this treated? Treatment for this condition depends on the type of incontinence that you have and its cause. Treatment may include:  Lifestyle changes, such as: ? Quitting smoking. ? Maintaining a healthy weight. ? Staying active. Try to get 150 minutes of moderate-intensity exercise every week. Ask your health care provider which activities are safe for you. ? Eating a healthy diet.  Avoid high-fat foods, like fried foods.  Avoid refined carbohydrates like white bread and white rice.  Limit how much alcohol and caffeine you drink.  Increase your fiber intake. Foods such as fresh fruits, vegetables, beans, and whole grains are healthy sources of fiber.  Pelvic floor muscle exercises.  Bladder training, such as lengthening the amount of  time between bathroom breaks, or using the bathroom at regular intervals.  Using techniques to suppress bladder urges. This can include distraction techniques or controlled breathing  exercises.  Medicines to relax the bladder muscles and prevent bladder spasms.  Medicines to help slow or prevent the growth of a man's prostate.  Botox injections. These can help relax the bladder muscles.  Using pulses of electricity to help change bladder reflexes (electrical nerve stimulation).  For women, using a medical device to prevent urine leaks. This is a small, tampon-like, disposable device that is inserted into the urethra.  Injecting collagen or carbon beads (bulking agents) into the urinary sphincter. These can help thicken tissue and close the bladder opening.  Surgery. Follow these instructions at home: Lifestyle  Limit alcohol and caffeine. These can fill your bladder quickly and irritate it.  Keep yourself clean to help prevent odors and skin damage. Ask your doctor about special skin creams and cleansers that can protect the skin from urine.  Consider wearing pads or adult diapers. Make sure to change them regularly, and always change them right after experiencing incontinence. General instructions  Take over-the-counter and prescription medicines only as told by your health care provider.  Use the bathroom about every 3-4 hours, even if you do not feel the need to urinate. Try to empty your bladder completely every time. After urinating, wait a minute. Then try to urinate again.  Make sure you are in a relaxed position while urinating.  If your incontinence is caused by nerve problems, keep a log of the medicines you take and the times you go to the bathroom.  Keep all follow-up visits as told by your health care provider. This is important. Contact a health care provider if:  You have pain that gets worse.  Your incontinence gets worse. Get help right away if:  You have a fever or chills.  You are unable to urinate.  You have redness in your groin area or down your legs. Summary  Urinary incontinence refers to a condition in which a person is  unable to control where and when to pass urine.  This condition may be caused by medicines, infection, weak bladder muscles, weak pelvic floor muscles, enlargement of the prostate (in men), or surgery.  The following factors increase your risk for developing this condition: older age, obesity, pregnancy and childbirth, menopause, neurological diseases, and chronic coughing.  There are several types of urinary incontinence. They include urge incontinence, stress incontinence, overflow incontinence, and functional incontinence.  This condition is usually treated first with lifestyle and behavioral changes, such as quitting smoking, eating a healthier diet, and doing regular pelvic floor exercises. Other treatment options include medicines, bulking agents, medical devices, electrical nerve stimulation, or surgery. This information is not intended to replace advice given to you by your health care provider. Make sure you discuss any questions you have with your health care provider. Document Released: 07/08/2004 Document Revised: 09/09/2016 Document Reviewed: 09/09/2016 Elsevier Interactive Patient Education  2019 [employer].  Cholesterol Content in Foods Cholesterol is a waxy, fat-like substance that helps to carry fat in the blood. The body needs cholesterol in small amounts, but too much cholesterol can cause damage to the arteries and heart. Most people should eat less than 200 milligrams (mg) of cholesterol a day. Foods with cholesterol  Cholesterol is found in animal-based foods, such as meat, seafood, and dairy. Generally, low-fat dairy and lean meats have less cholesterol than full-fat dairy and fatty meats. The  milligrams of cholesterol per serving (mg per serving) of common cholesterol-containing foods are listed below. Meat and other proteins  Egg - one large whole egg has 186 mg.  Veal shank - 4 oz has 141 mg.  Lean ground [place] (93% lean) - 4 oz has 118 mg.  Fat-trimmed lamb  loin - 4 oz has 106 mg.  Lean ground beef (90% lean) - 4 oz has 100 mg.  Lobster - 3.5 oz has 90 mg.  Pork loin chops - 4 oz has 86 mg.  Canned salmon - 3.5 oz has 83 mg.  Fat-trimmed beef top loin - 4 oz has 78 mg.  Frankfurter - 1 frank (3.5 oz) has 77 mg.  Crab - 3.5 oz has 71 mg.  Roasted chicken without skin, white meat - 4 oz has 66 mg.  Light bologna - 2 oz has 45 mg.  Deli-cut [place] - 2 oz has 31 mg.  Canned tuna - 3.5 oz has 31 mg.  Bacon - 1 oz has 29 mg.  Oysters and mussels (raw) - 3.5 oz has 25 mg.  Mackerel - 1 oz has 22 mg.  Trout - 1 oz has 20 mg.  Pork sausage - 1 link (1 oz) has 17 mg.  Salmon - 1 oz has 16 mg.  Tilapia - 1 oz has 14 mg. Dairy  Soft-serve ice cream -  cup (4 oz) has 103 mg.  Whole-milk yogurt - 1 cup (8 oz) has 29 mg.  Cheddar cheese - 1 oz has 28 mg.  American cheese - 1 oz has 28 mg.  Whole milk - 1 cup (8 oz) has 23 mg.  2% milk - 1 cup (8 oz) has 18 mg.  Cream cheese - 1 tablespoon (Tbsp) has 15 mg.  Cottage cheese -  cup (4 oz) has 14 mg.  Low-fat (1%) milk - 1 cup (8 oz) has 10 mg.  Sour cream - 1 Tbsp has 8.5 mg.  Low-fat yogurt - 1 cup (8 oz) has 8 mg.  Nonfat Greek yogurt - 1 cup (8 oz) has 7 mg.  Half-and-half cream - 1 Tbsp has 5 mg. Fats and oils  Cod liver oil - 1 tablespoon (Tbsp) has 82 mg.  Butter - 1 Tbsp has 15 mg.  Lard - 1 Tbsp has 14 mg.  Bacon grease - 1 Tbsp has 14 mg.  Mayonnaise - 1 Tbsp has 5-10 mg.  Margarine - 1 Tbsp has 3-10 mg. Exact amounts of cholesterol in these foods may vary depending on specific ingredients and brands. Foods without cholesterol Most plant-based foods do not have cholesterol unless you combine them with a food that has cholesterol. Foods without cholesterol include:  Grains and cereals.  Vegetables.  Fruits.  Vegetable oils, such as olive, canola, and sunflower oil.  Legumes, such as peas, beans, and lentils.  Nuts and seeds.  Egg  whites. Summary  The body needs cholesterol in small amounts, but too much cholesterol can cause damage to the arteries and heart.  Most people should eat less than 200 milligrams (mg) of cholesterol a day. This information is not intended to replace advice given to you by your health care provider. Make sure you discuss any questions you have with your health care provider. Document Released: 01/25/2017 Document Revised: 01/25/2017 Document Reviewed: 01/25/2017 Elsevier Interactive Patient Education  [employer].

## 2018-08-18 ENCOUNTER — Other Ambulatory Visit: Payer: BLUE CROSS/BLUE SHIELD

## 2018-08-19 LAB — HEMOGLOBIN A1C
Est. average glucose Bld gHb Est-mCnc: 123 mg/dL
HEMOGLOBIN A1C: 5.9 % — AB (ref 4.8–5.6)

## 2018-08-19 LAB — COMPREHENSIVE METABOLIC PANEL
ALK PHOS: 62 IU/L (ref 39–117)
ALT: 15 IU/L (ref 0–32)
AST: 8 IU/L (ref 0–40)
Albumin/Globulin Ratio: 2 (ref 1.2–2.2)
Albumin: 4.2 g/dL (ref 3.8–4.8)
BILIRUBIN TOTAL: 0.8 mg/dL (ref 0.0–1.2)
BUN / CREAT RATIO: 13 (ref 9–23)
BUN: 12 mg/dL (ref 6–24)
CO2: 23 mmol/L (ref 20–29)
CREATININE: 0.92 mg/dL (ref 0.57–1.00)
Calcium: 9.2 mg/dL (ref 8.7–10.2)
Chloride: 103 mmol/L (ref 96–106)
GFR calc Af Amer: 85 mL/min/{1.73_m2} (ref >59)
GFR, EST NON AFRICAN AMERICAN: 73 mL/min/{1.73_m2} (ref >59)
GLOBULIN, TOTAL: 2.1 g/dL (ref 1.5–4.5)
GLUCOSE: 102 mg/dL — AB (ref 65–99)
Potassium: 4.2 mmol/L (ref 3.5–5.2)
SODIUM: 139 mmol/L (ref 134–144)
Total Protein: 6.3 g/dL (ref 6.0–8.5)

## 2018-08-19 LAB — NMR, LIPOPROFILE
Cholesterol, Total: 250 mg/dL — ABNORMAL HIGH (ref 100–199)
HDL PARTICLE NUMBER: 31 umol/L (ref >=30.5)
HDL-C: 46 mg/dL (ref >39)
LDL Particle Number: 2165 nmol/L — ABNORMAL HIGH (ref <1000)
LDL Size: 20.9 nm (ref >20.5)
LDL-C: 165 mg/dL — AB (ref 0–99)
LP-IR Score: 66 — ABNORMAL HIGH (ref <=45)
SMALL LDL PARTICLE NUMBER: 1047 nmol/L — AB (ref <=527)
Triglycerides: 193 mg/dL — ABNORMAL HIGH (ref 0–149)

## 2018-08-19 LAB — THYROID PANEL WITH TSH
Free Thyroxine Index: 2 (ref 1.2–4.9)
T3 UPTAKE RATIO: 30 % (ref 24–39)
T4 TOTAL: 6.7 ug/dL (ref 4.5–12.0)
TSH: 2.73 u[IU]/mL (ref 0.450–4.500)

## 2018-08-19 LAB — VITAMIN D 25 HYDROXY (VIT D DEFICIENCY, FRACTURES): VIT D 25 HYDROXY: 33.8 ng/mL (ref 30.0–100.0)

## 2018-08-23 LAB — CYTOLOGY - PAP
DIAGNOSIS: NEGATIVE
HPV (WINDOPATH): NOT DETECTED

## 2018-08-30 ENCOUNTER — Ambulatory Visit: Payer: BLUE CROSS/BLUE SHIELD

## 2018-10-13 ENCOUNTER — Telehealth: Payer: Self-pay | Admitting: Obstetrics and Gynecology

## 2018-10-13 NOTE — Telephone Encounter (Signed)
The patient called and stated that she would like to speak with someone within the office in regards to her receiving a 2nd bill totaling $185.00. I informed the patient that I can send a message to [facility] to review and possibly have a solution for her. The patient was advised that [facility] will be out of office until 10/17/2018, Pt voiced understanding and stated she was okay with that. Please advise.

## 2018-10-17 NOTE — Telephone Encounter (Signed)
I spoke with the patient. The bill is not with our office, it is a bill from the [facility] she believes. She does not have the bill with her to confirm but she is to call back when she has it in hand. -KEC

## 2019-01-10 ENCOUNTER — Telehealth: Payer: Self-pay | Admitting: Obstetrics and Gynecology

## 2019-01-10 ENCOUNTER — Other Ambulatory Visit: Payer: Self-pay

## 2019-01-10 DIAGNOSIS — F988 Other specified behavioral and emotional disorders with onset usually occurring in childhood and adolescence: Secondary | ICD-10-CM

## 2019-01-10 NOTE — Telephone Encounter (Signed)
The patient walked into the office demanding to speak with Melody in regards to her having a billing/coding issue with her insurance. Notified Nurse, Nurse spoke with pt directly. Please advise.

## 2019-01-10 NOTE — Telephone Encounter (Signed)
pls advise

## 2019-01-11 ENCOUNTER — Other Ambulatory Visit: Payer: Self-pay | Admitting: Obstetrics and Gynecology

## 2019-01-11 DIAGNOSIS — F988 Other specified behavioral and emotional disorders with onset usually occurring in childhood and adolescence: Secondary | ICD-10-CM

## 2019-01-11 MED ORDER — LISDEXAMFETAMINE DIMESYLATE 20 MG PO CAPS: 20.0000 mg | ORAL_CAPSULE | ORAL | 0 refills | Status: DC

## 2019-02-02 NOTE — Telephone Encounter (Signed)
Spoke with pt

## 2019-07-31 ENCOUNTER — Other Ambulatory Visit: Payer: Self-pay

## 2019-07-31 ENCOUNTER — Emergency Department
Admission: EM | Admit: 2019-07-31 | Discharge: 2019-07-31 | Disposition: A | Discharge status: Home / Self Care | Payer: BC Managed Care – PPO | Attending: Emergency Medicine | Admitting: Emergency Medicine

## 2019-07-31 ENCOUNTER — Emergency Department: Payer: BC Managed Care – PPO

## 2019-07-31 DIAGNOSIS — I10 Essential (primary) hypertension: Secondary | ICD-10-CM | POA: Diagnosis not present

## 2019-07-31 DIAGNOSIS — K529 Noninfective gastroenteritis and colitis, unspecified: Secondary | ICD-10-CM | POA: Diagnosis not present

## 2019-07-31 DIAGNOSIS — R1013 Epigastric pain: Secondary | ICD-10-CM | POA: Diagnosis present

## 2019-07-31 LAB — URINALYSIS, COMPLETE (UACMP) WITH MICROSCOPIC
Bilirubin Urine: NEGATIVE
Glucose, UA: NEGATIVE mg/dL
Hgb urine dipstick: NEGATIVE
Ketones, ur: NEGATIVE mg/dL
Nitrite: NEGATIVE
Protein, ur: NEGATIVE mg/dL
Specific Gravity, Urine: 1.023 (ref 1.005–1.030)
pH: 5 (ref 5.0–8.0)

## 2019-07-31 LAB — COMPREHENSIVE METABOLIC PANEL
ALT: 26 U/L (ref 0–44)
AST: 17 U/L (ref 15–41)
Albumin: 4 g/dL (ref 3.5–5.0)
Alkaline Phosphatase: 57 U/L (ref 38–126)
Anion gap: 5 (ref 5–15)
BUN: 11 mg/dL (ref 6–20)
CO2: 29 mmol/L (ref 22–32)
Calcium: 8.8 mg/dL — ABNORMAL LOW (ref 8.9–10.3)
Chloride: 103 mmol/L (ref 98–111)
Creatinine, Ser: 0.8 mg/dL (ref 0.44–1.00)
GFR calc Af Amer: >60 mL/min (ref >60)
GFR calc non Af Amer: >60 mL/min (ref >60)
Glucose, Bld: 117 mg/dL — ABNORMAL HIGH (ref 70–99)
Potassium: 3.5 mmol/L (ref 3.5–5.1)
Sodium: 137 mmol/L (ref 135–145)
Total Bilirubin: 1 mg/dL (ref 0.3–1.2)
Total Protein: 6.9 g/dL (ref 6.5–8.1)

## 2019-07-31 LAB — CBC WITH DIFFERENTIAL/PLATELET
Abs Immature Granulocytes: 0.05 10*3/uL (ref 0.00–0.07)
Basophils Absolute: 0.1 10*3/uL (ref 0.0–0.1)
Basophils Relative: 0 %
Eosinophils Absolute: 0.2 10*3/uL (ref 0.0–0.5)
Eosinophils Relative: 1 %
HCT: 44.4 % (ref 36.0–46.0)
Hemoglobin: 15 g/dL (ref 12.0–15.0)
Immature Granulocytes: 0 %
Lymphocytes Relative: 30 %
Lymphs Abs: 4.2 10*3/uL — ABNORMAL HIGH (ref 0.7–4.0)
MCH: 30.3 pg (ref 26.0–34.0)
MCHC: 33.8 g/dL (ref 30.0–36.0)
MCV: 89.7 fL (ref 80.0–100.0)
Monocytes Absolute: 0.8 10*3/uL (ref 0.1–1.0)
Monocytes Relative: 6 %
Neutro Abs: 8.8 10*3/uL — ABNORMAL HIGH (ref 1.7–7.7)
Neutrophils Relative %: 63 %
Platelets: 284 10*3/uL (ref 150–400)
RBC: 4.95 MIL/uL (ref 3.87–5.11)
RDW: 12.2 % (ref 11.5–15.5)
WBC: 14 10*3/uL — ABNORMAL HIGH (ref 4.0–10.5)
nRBC: 0 % (ref 0.0–0.2)

## 2019-07-31 LAB — LIPASE, BLOOD: Lipase: 50 U/L (ref 11–51)

## 2019-07-31 LAB — POC URINE PREG, ED: Preg Test, Ur: NEGATIVE

## 2019-07-31 LAB — LACTIC ACID, PLASMA: Lactic Acid, Venous: 1 mmol/L (ref 0.5–1.9)

## 2019-07-31 IMAGING — CT CT ABD-PELV W/ CM
2 of 5 series · 16 of 46 positions shown, 18 images · IV contrast (APPLIED)
Comparison: Abdominal radiographs earlier today.

CLINICAL DATA: 48-year-old female with abdominal pain and
distension, nausea.

EXAM:
CT ABDOMEN AND PELVIS WITH CONTRAST
TECHNIQUE: Multidetector CT imaging of the abdomen and pelvis was performed
using the standard protocol following bolus administration of
intravenous contrast.
CONTRAST:  100mL OMNIPAQUE IOHEXOL 300 MG/ML  SOLN

[Series 2: axial st · axial · 0.76mm/px · z∈[-506,-86]mm · 13 of 94 slices shown, 15 images]
[im 5/94  soft-tissue]
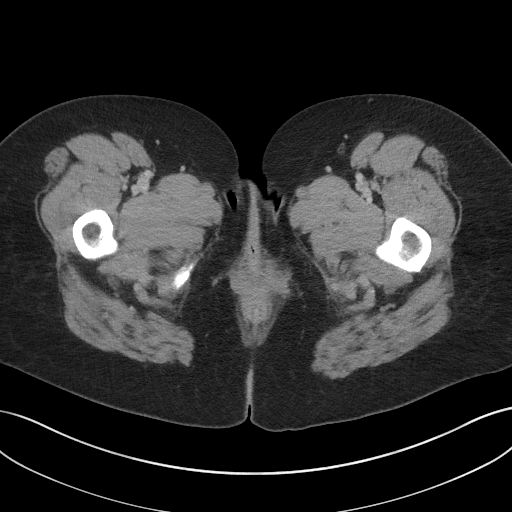
[im 5/94  bone]
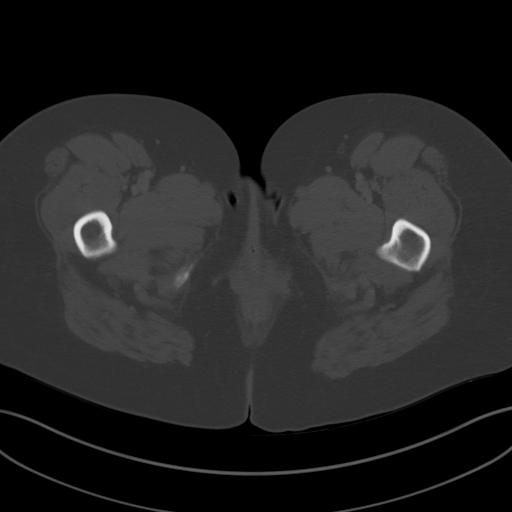
[im 14/94  soft-tissue]
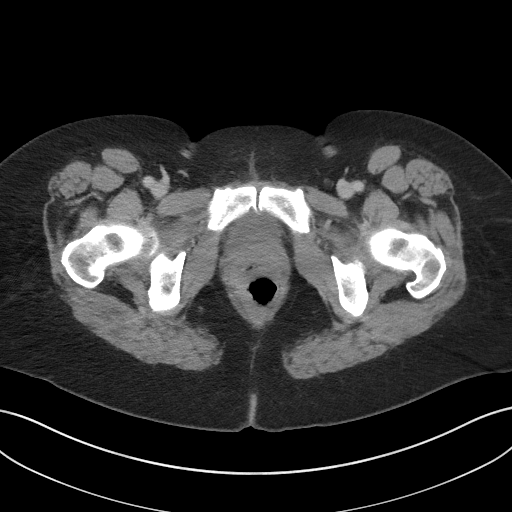
[im 18/94  soft-tissue]
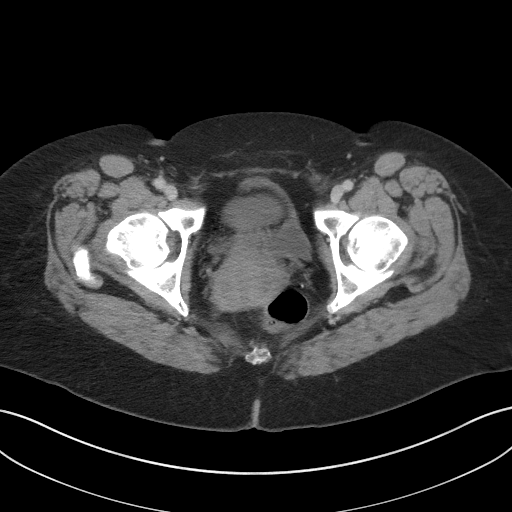
[im 27/94  soft-tissue]
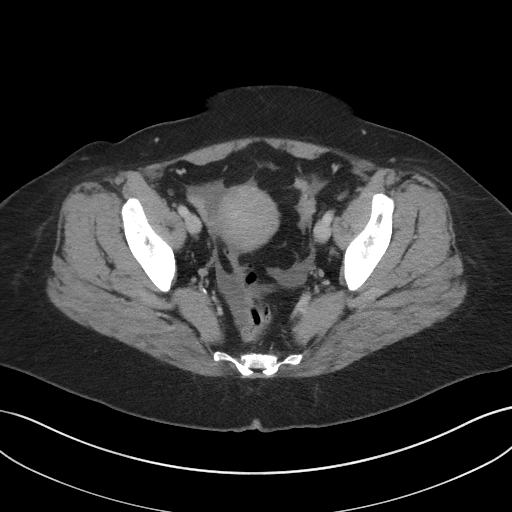
[im 32/94  soft-tissue]
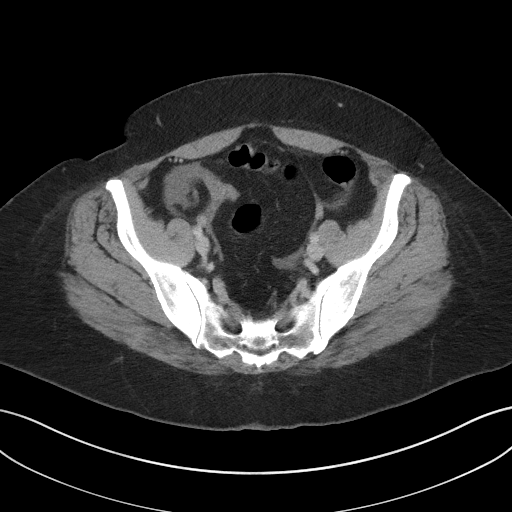
[im 40/94  soft-tissue]
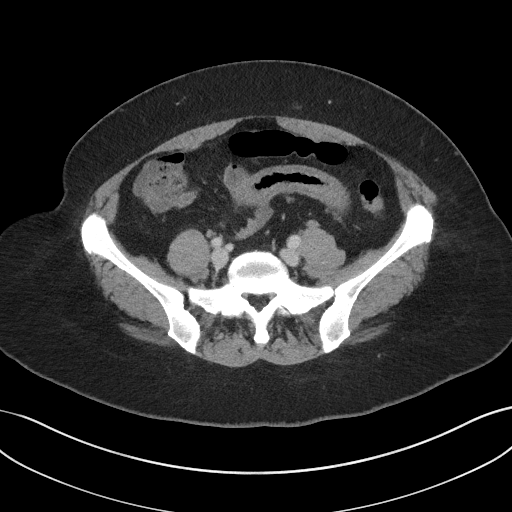
[im 49/94  soft-tissue]
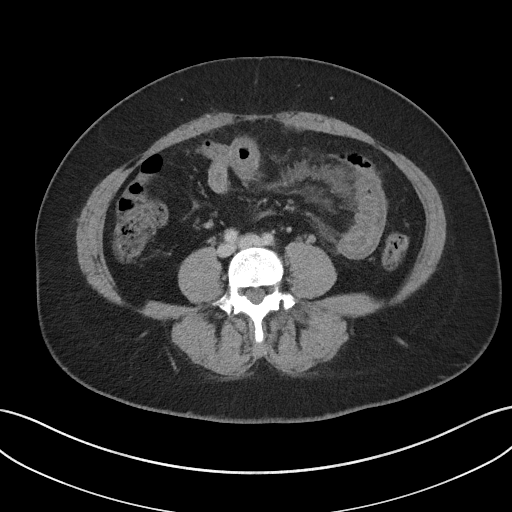
[im 54/94  soft-tissue]
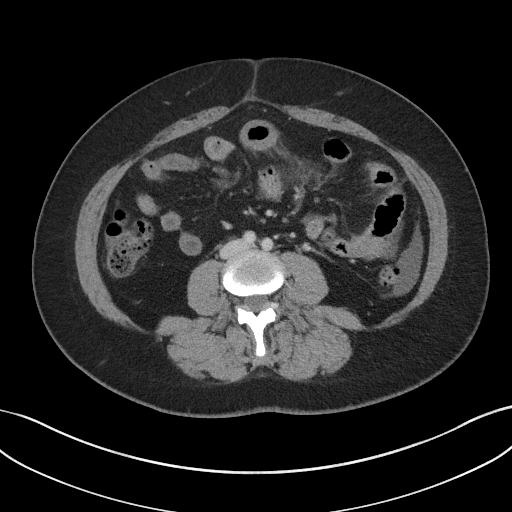
[im 63/94  soft-tissue]
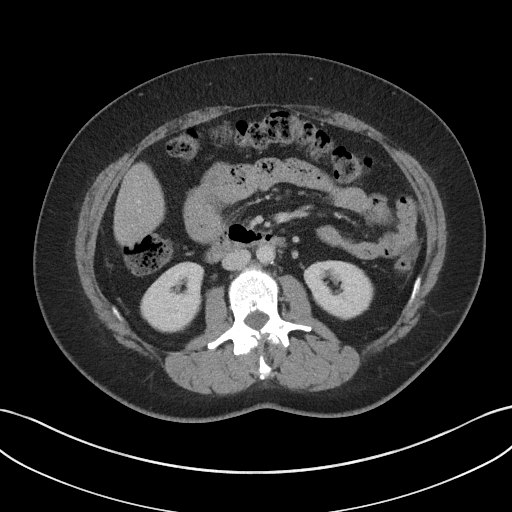
[im 63/94  bone]
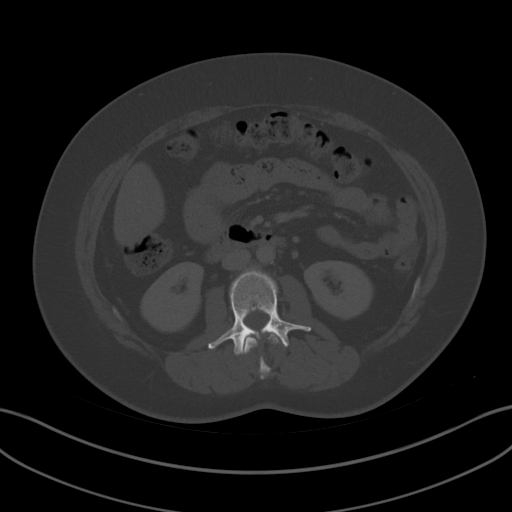
[im 67/94  soft-tissue]
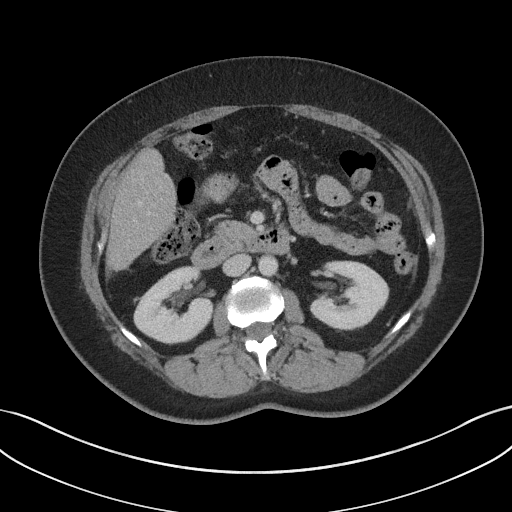
[im 76/94  soft-tissue]
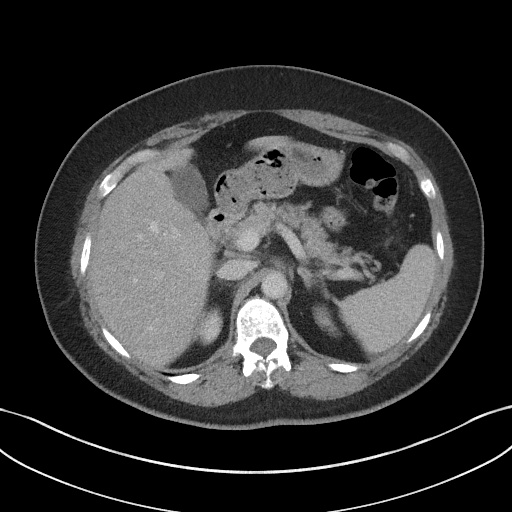
[im 80/94  soft-tissue]
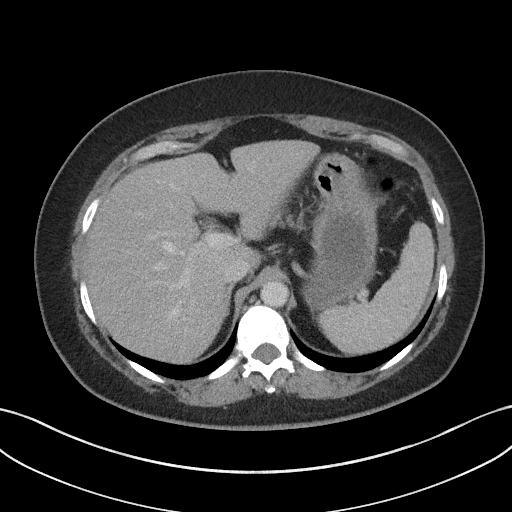
[im 89/94  soft-tissue]
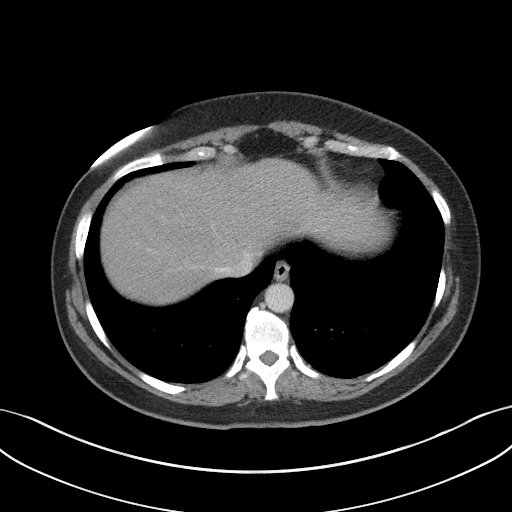

[Series 5: coronal st · coronal · 0.92mm/px · 3 of 92 slices shown]
[im 31/92  soft-tissue]
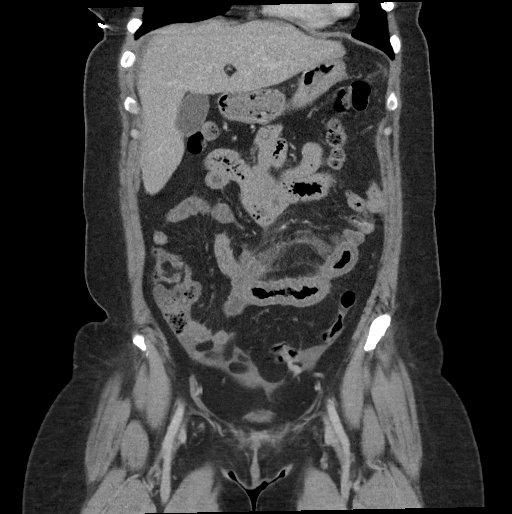
[im 41/92  soft-tissue]
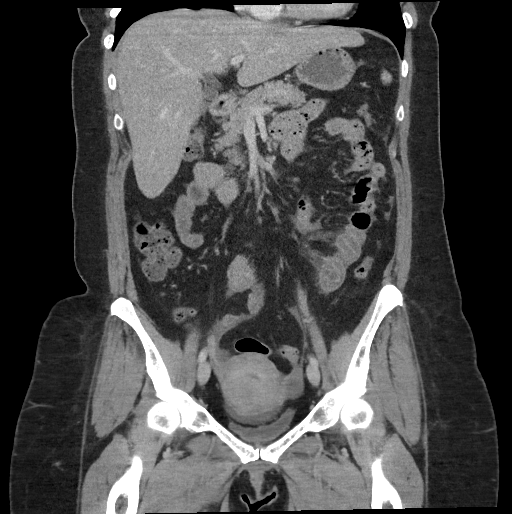
[im 51/92  soft-tissue]
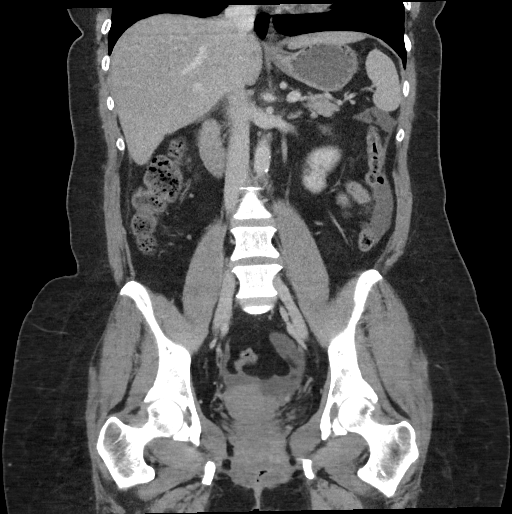

[16 of 46 positions shown; findings below may reference images not displayed]

FINDINGS: Lower chest: Negative.

Hepatobiliary: Trace simple appearing perihepatic free fluid. But
otherwise negative liver and gallbladder.

Pancreas: Negative.

Spleen: Trace simple appearing perisplenic fluid but otherwise
negative.

Adrenals/Urinary Tract: Normal adrenal glands.

Bilateral renal enhancement and contrast excretion is symmetric and
within normal limits. Decompressed proximal ureters. No
nephrolithiasis. Diminutive and unremarkable urinary bladder.

Stomach/Bowel: Negative rectosigmoid colon, the sigmoid is
redundant. There is a small volume of free fluid in the left gutter,
but the descending colon remains within normal limits. Mild retained
stool in the transverse and right colon. Normal appendix on series
2, image 59. No convincing large bowel inflammation.

The terminal ileum is decompressed and negative. There are several
contiguous thick-walled, mildly dilated, and inflamed small bowel
loops in the mid abdomen with pronounced circumferential wall
thickening up to 10 mm diameter. Associated confluent mesenteric
stranding. The affected loops slowly transition to more normal
appearing upstream and downstream small bowel. No abrupt transition.
No pneumatosis or extraluminal gas.

Negative stomach, duodenum and proximal jejunum. No free air. Trace
free fluid in the upper abdomen.

Vascular/Lymphatic: Mild Aortoiliac calcified atherosclerosis. The
major arterial structures are patent.

No definite celiac, SMA or IMA atherosclerosis identified.

Portal venous system is patent. No lymphadenopathy.

Reproductive: Negative.

Other: Moderate volume of pelvic free fluid with simple fluid
density on series 2, image 71.

Musculoskeletal: No acute osseous abnormality identified.
IMPRESSION: 1. Several contiguous thick walled and severely inflamed mid small
bowel loops with a small to moderate volume of associated free fluid
in the abdomen and pelvis.
The constellation is most compatible with acute infectious
enteritis, but consider correlation with lactate to help exclude the
possibility of bowel ischemia.
No associated abscess or other complicating features.

2. Elsewhere negative CT abdomen and pelvis; only mild aortic
atherosclerosis.

## 2019-07-31 MED ORDER — SODIUM CHLORIDE 0.9 % IV SOLN: Freq: Once | INTRAVENOUS | Status: AC

## 2019-07-31 MED ORDER — IOHEXOL 300 MG/ML  SOLN
100.0000 mL | Freq: Once | INTRAMUSCULAR | Status: AC | PRN
  Administered 2019-07-31: 100 mL via INTRAVENOUS
  Filled 2019-07-31: qty 100

## 2019-07-31 MED ORDER — DICYCLOMINE HCL 10 MG PO CAPS: 10.0000 mg | ORAL_CAPSULE | Freq: Four times a day (QID) | ORAL | 0 refills | Status: DC

## 2019-07-31 MED ORDER — ONDANSETRON 4 MG PO TBDP: 4.0000 mg | ORAL_TABLET | Freq: Three times a day (TID) | ORAL | 0 refills | Status: DC | PRN

## 2019-07-31 MED ORDER — ONDANSETRON HCL 4 MG/2ML IJ SOLN
4.0000 mg | Freq: Once | INTRAMUSCULAR | Status: AC
  Administered 2019-07-31: 4 mg via INTRAVENOUS
  Filled 2019-07-31: qty 2

## 2019-07-31 MED ORDER — MORPHINE SULFATE (PF) 4 MG/ML IV SOLN
4.0000 mg | Freq: Once | INTRAVENOUS | Status: DC
  Filled 2019-07-31: qty 1

## 2019-07-31 MED ORDER — KETOROLAC TROMETHAMINE 30 MG/ML IJ SOLN
30.0000 mg | Freq: Once | INTRAMUSCULAR | Status: AC
  Administered 2019-07-31: 30 mg via INTRAVENOUS
  Filled 2019-07-31: qty 1

## 2019-07-31 NOTE — ED Provider Notes (Signed)
[facility] Emergency Department Provider Note       Time seen: ----------------------------------------- 7:20 PM on 07/31/2019 -----------------------------------------   I have reviewed the triage vital signs and the nursing notes.  HISTORY   Chief Complaint Abdominal Pain    HPI Carla Oliver is a 49 y.o. female with a history of ADD, hyperlipidemia, hypertension, obesity, sleep disturbance who presents to the ED for severe epigastric pain that began around 1030 this morning.  She reports only having coffee this morning, denies nausea, no vomiting.  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  Pain is intermittent, currently 2 out of 10.  Past Medical History:  Diagnosis Date  . ADD (attention deficit disorder)   . Hyperlipidemia   . Hypertension   . Obesity   . Sleep disturbance     Patient Active Problem List   Diagnosis Date Noted  . Attention deficit disorder of adult 03/06/2015  . Hypertension goal BP (blood pressure) < 140/90 03/06/2015  . Adjustment disorder with anxious mood 03/06/2015  . Insomnia 03/06/2015  . Constipation, slow transit 03/06/2015  . Incomplete immunization status 03/06/2015  . Hyperlipemia, mixed 01/30/2015  . Vitamin D deficiency 01/29/2015    Past Surgical History:  Procedure Laterality Date  . DILATION AND CURETTAGE OF UTERUS  2001    Allergies Patient has no known allergies.  Social History Social History   Tobacco Use  . Smoking status: Never Smoker  . Smokeless tobacco: Never Used  Substance Use Topics  . Alcohol use: Yes    Comment: social  . Drug use: No    Review of Systems Constitutional: Negative for fever. Cardiovascular: Negative for chest pain. Respiratory: Negative for shortness of breath. Gastrointestinal: Positive for abdominal pain Musculoskeletal: Negative for back pain. Skin: Negative for rash. Neurological: Negative for headaches, focal weakness or  numbness.  All systems negative/normal/unremarkable except as stated in the HPI  ____________________________________________   PHYSICAL EXAM:  VITAL SIGNS: ED Triage Vitals  Enc Vitals Group     BP 07/31/19 1905 (!) 174/108     Pulse Rate 07/31/19 1905 98     Resp 07/31/19 1905 18     Temp 07/31/19 1905 97.9 F (36.6 C)     Temp Source 07/31/19 1905 Oral     SpO2 07/31/19 1905 100 %     Weight 07/31/19 1909 189 lb (85.7 kg)     Height 07/31/19 1909 5\' 6"  (1.676 m)     Head Circumference --      Peak Flow --      Pain Score 07/31/19 1909 2     Pain Loc --      Pain Edu? --      Excl. in [place]? --     Constitutional: Alert and oriented.  Mild distress with pain Eyes: Conjunctivae are normal. Normal extraocular movements. ENT      Head: Normocephalic and atraumatic.      Nose: No congestion/rhinnorhea.      Mouth/Throat: Mucous membranes are moist.      Neck: No stridor. Cardiovascular: Normal rate, regular rhythm. No murmurs, rubs, or gallops. Respiratory: Normal respiratory effort without tachypnea nor retractions. Breath sounds are clear and equal bilaterally. No wheezes/rales/rhonchi. Gastrointestinal: Soft and nontender. Normal bowel sounds, no tenderness of the right lower quadrant, negative Murphy sign Musculoskeletal: Nontender with normal range of motion in extremities. No lower extremity tenderness nor edema. Neurologic:  Normal speech and language. No gross focal neurologic deficits are appreciated.  Skin:  Skin is warm, dry and intact. No rash noted. Psychiatric: Mood and affect are normal. Speech and behavior are normal.  ____________________________________________  EKG: Interpreted by me.  Sinus rhythm with a rate of 89 bpm, rightward axis, normal QT  ____________________________________________  ED COURSE:  As part of my medical decision making, I reviewed the following data within the [place] History obtained from family if available,  nursing notes, old chart and ekg, as well as notes from prior ED visits. Patient presented for abdominal pain, we will assess with labs and imaging as indicated at this time.   Procedures  Carla Oliver was evaluated in Emergency Department on 07/31/2019 for the symptoms described in the history of present illness. She was evaluated in the context of the global COVID-19 pandemic, which necessitated consideration that the patient might be at risk for infection with the SARS-CoV-2 virus that causes COVID-19. Institutional protocols and algorithms that pertain to the evaluation of patients at risk for COVID-19 are in a state of rapid change based on information released by regulatory bodies including the CDC and federal and state organizations. These policies and algorithms were followed during the patient's care in the ED.  ____________________________________________   LABS (pertinent positives/negatives)  Labs Reviewed  CBC WITH DIFFERENTIAL/PLATELET - Abnormal; Notable for the following components:      Result Value   WBC 14.0 (*)    Neutro Abs 8.8 (*)    Lymphs Abs 4.2 (*)    All other components within normal limits  COMPREHENSIVE METABOLIC PANEL - Abnormal; Notable for the following components:   Glucose, Bld 117 (*)    Calcium 8.8 (*)    All other components within normal limits  URINALYSIS, COMPLETE (UACMP) WITH MICROSCOPIC - Abnormal; Notable for the following components:   Color, Urine YELLOW (*)    APPearance HAZY (*)    Leukocytes,Ua TRACE (*)    Bacteria, UA RARE (*)    All other components within normal limits  LIPASE, BLOOD  LACTIC ACID, PLASMA  POC URINE PREG, ED    RADIOLOGY Images were viewed by me  Abdomen two view/CT  IMPRESSION:  1. Several contiguous thick walled and severely inflamed mid small  bowel loops with a small to moderate volume of associated free fluid  in the abdomen and pelvis.  The constellation is most compatible with acute infectious   enteritis, but consider correlation with lactate to help exclude the  possibility of bowel ischemia.  No associated abscess or other complicating features.   2. Elsewhere negative CT abdomen and pelvis; only mild aortic  atherosclerosis.  ____________________________________________   DIFFERENTIAL DIAGNOSIS   Gastritis, peptic ulcer disease, GERD, gastroenteritis, pancreatitis, biliary colic  FINAL ASSESSMENT AND PLAN  Abdominal pain, acute infectious enteritis   Plan: The patient had presented for epigastric pain. Patient's labs did indicate mild leukocytosis with a white count of 14,000. Patient's imaging revealed several contiguous thick-walled and severely inflamed small bowel loops with small to moderate volume free fluid.  This likely represented acute infectious enteritis.  Lactic acid level was negative.  She will be discharged with Zofran, Bentyl, probiotics.  She is advised to return for worsening or worrisome symptoms.   Laurence Aly, MD    Note: This note was generated in part or whole with voice recognition software. Voice recognition is usually quite accurate but there are transcription errors that can and very often do occur. I apologize for any typographical errors that were not detected and  corrected.     Earleen Newport, MD 07/31/19 2152

## 2019-07-31 NOTE — ED Triage Notes (Signed)
Pt to the er for abd pain that began at 1030 this am. Pt has substernal chest pain with it. Pt reports only having coffee that morning. Pt denies nausea with movement. No emesis. Pain is intermittent.

## 2019-07-31 NOTE — ED Notes (Signed)
Pt reports that she started with abd pain this am that has been intermittent throuhgout the day - pt reports that at 1040am she became nauseated and that has not resolved - pt reports last BM today and it was regular for her norm - pt declines morphine at this time d/t no pain

## 2019-09-03 ENCOUNTER — Other Ambulatory Visit: Payer: Self-pay

## 2019-09-03 ENCOUNTER — Encounter: Payer: Self-pay | Admitting: Gastroenterology

## 2019-09-03 ENCOUNTER — Ambulatory Visit (INDEPENDENT_AMBULATORY_CARE_PROVIDER_SITE_OTHER): Payer: BC Managed Care – PPO | Admitting: Gastroenterology

## 2019-09-03 VITALS — BP 128/79 | HR 79 | Temp 97.7°F | Ht 66.0 in | Wt 192.4 lb

## 2019-09-03 DIAGNOSIS — R1013 Epigastric pain: Secondary | ICD-10-CM

## 2019-09-03 DIAGNOSIS — G8929 Other chronic pain: Secondary | ICD-10-CM | POA: Diagnosis not present

## 2019-09-03 NOTE — Progress Notes (Signed)
Gastroenterology Consultation  Referring Provider:     Dr. Lenise Arena Primary Care Physician:  Patient, No Pcp Per Primary Gastroenterologist:  Dr. Allen Norris     Reason for Consultation:     Referral from the emergency department for enteritis        HPI:   Carla Oliver is a 49 y.o. y/o female referred for consultation & management of referral from the emergency department for enteritis by Dr. Patient, No Pcp Per.  This patient comes in after being seen in the emergency room back at the end of January with severe abdominal pain.  The patient underwent a CT scan of the abdomen that showed:  IMPRESSION: 1. Several contiguous thick walled and severely inflamed mid small bowel loops with a small to moderate volume of associated free fluid  in the abdomen and pelvis. The constellation is most compatible with acute infectious enteritis, but consider correlation with lactate to help exclude the possibility of bowel ischemia. No associated abscess or other complicating features.  2. Elsewhere negative CT abdomen and pelvis; only mild aortic Atherosclerosis.  The patient's blood work showed a normal lactic acid, CMP and a elevated white cell count of 14.  The patient was sent home with Zofran, Bentyl and probiotics. The patient reports that her symptoms have completely resolved.  There is no report of any unexplained weight loss fevers chills nausea or vomiting.  The patient did have concerned because her friends told her that it sounded like she was having diverticulitis although she was not having fevers or chills or anything that would resemble diverticulitis.  The patient also states that she had eaten a steak just prior to the symptoms starting and she states that it was more raw than she had expected it to be.  Past Medical History:  Diagnosis Date  . ADD (attention deficit disorder)   . Hyperlipidemia   . Hypertension   . Obesity   . Sleep disturbance     Past Surgical History:   Procedure Laterality Date  . DILATION AND CURETTAGE OF UTERUS  2001    Prior to Admission medications   Medication Sig Start Date End Date Taking? Authorizing Provider  ALPRAZolam Duanne Moron) 0.5 MG tablet Take 1 tablet (0.5 mg total) by mouth 3 (three) times daily as needed for anxiety. 08/17/18   Shambley, Melody N, CNM  dicyclomine (BENTYL) 10 MG capsule Take 1 capsule (10 mg total) by mouth 4 (four) times daily for 14 days. 07/31/19 08/14/19  Earleen Newport, MD  lisdexamfetamine (VYVANSE) 20 MG capsule Take 1 capsule (20 mg total) by mouth every morning. 03/14/19   Shambley, Melody N, CNM  lisinopril (PRINIVIL,ZESTRIL) 20 MG tablet TAKE 1 TABLET(20 MG) BY MOUTH DAILY 08/17/18   Shambley, Melody N, CNM  ondansetron (ZOFRAN ODT) 4 MG disintegrating tablet Take 1 tablet (4 mg total) by mouth every 8 (eight) hours as needed for nausea or vomiting. 07/31/19   Earleen Newport, MD  rosuvastatin (CRESTOR) 10 MG tablet Take 1 tablet (10 mg total) by mouth daily. Patient not taking: Reported on 05/12/2017 05/08/16   Shambley, Melody N, CNM  Vitamin D, Ergocalciferol, (DRISDOL) 1.25 MG (50000 UT) CAPS capsule TAKE 1 CAPSULE BY MOUTH WEEKLY 05/15/18   Shambley, Melody N, CNM    Family History  Problem Relation Age of Onset  . Cancer Mother        lung- non smoker  . Hypertension Mother   . Hypertension Father      Social  History   Tobacco Use  . Smoking status: Never Smoker  . Smokeless tobacco: Never Used  Substance Use Topics  . Alcohol use: Yes    Comment: social  . Drug use: No    Allergies as of 09/03/2019  . (No Known Allergies)    Review of Systems:    All systems reviewed and negative except where noted in HPI.   Physical Exam:  There were no vitals taken for this visit. No LMP recorded. General:   Alert,  Well-developed, well-nourished, pleasant and cooperative in NAD Head:  Normocephalic and atraumatic. Eyes:  Sclera clear, no icterus.   Conjunctiva pink. Ears:   Normal auditory acuity. Neck:  Supple; no masses or thyromegaly. Lungs:  Respirations even and unlabored.  Clear throughout to auscultation.   No wheezes, crackles, or rhonchi. No acute distress. Heart:  Regular rate and rhythm; no murmurs, clicks, rubs, or gallops. Abdomen:  Normal bowel sounds.  No bruits.  Soft, non-tender and non-distended without masses, hepatosplenomegaly or hernias noted.  No guarding or rebound tenderness.  Negative Carnett sign.   Rectal:  Deferred.  Pulses:  Normal pulses noted. Extremities:  No clubbing or edema.  No cyanosis. Neurologic:  Alert and oriented x3;  grossly normal neurologically. Skin:  Intact without significant lesions or rashes.  No jaundice. Lymph Nodes:  No significant cervical adenopathy. Psych:  Alert and cooperative. Normal mood and affect.  Imaging Studies: No results found.  Assessment and Plan:   Carla Oliver is a 49 y.o. y/o female who comes in today with a history of going to the ER with the diagnosis of enteritis.  The patient is not having any symptoms any longer although she does report that her bowels have changed from 3 times a day to just once a day.  The patient is due for a colonoscopy and states that her father had 7 inches of his colon removed due to polyps.  The patient is a Pharmacist, hospital at a local school and would like to wait till May to undergo her colonoscopy.  The patient is been given my card and has been told to call me to set up the colonoscopy when she is ready.  The patient has been explained the plan agrees with it.    Lucilla Lame, MD. Marval Regal    Note: This dictation was prepared with Dragon dictation along with smaller phrase technology. Any transcriptional errors that result from this process are unintentional.

## 2019-09-21 ENCOUNTER — Telehealth: Payer: Self-pay | Admitting: Certified Nurse Midwife

## 2019-09-21 ENCOUNTER — Telehealth: Payer: Self-pay

## 2019-09-21 MED ORDER — LISINOPRIL 20 MG PO TABS: ORAL_TABLET | ORAL | 1 refills | Status: AC

## 2019-09-21 NOTE — Telephone Encounter (Signed)
Please contact and provide bridge prescription until [facility]

## 2019-09-21 NOTE — Telephone Encounter (Signed)
Carla Oliver patient called in wanting a prescription for blood pressure medication refilled. Informed patient she hasn't been seen in over a year so she may need a visit in order to get the prescription refilled. Patient became aggravated and began using vulgar language stating that she's been out since March. Informed patient I understood her frustrations and that I would send provider a message to see if we could get it refilled prior to her appt made in May.

## 2019-09-21 NOTE — Telephone Encounter (Signed)
Patient requested to speak with you. Asked for provider to give her a call. Patient didn't want to go in specifics.

## 2019-09-21 NOTE — Telephone Encounter (Signed)
LMTRC

## 2019-09-21 NOTE — Telephone Encounter (Signed)
Handled by CM. Aware. JML

## 2019-09-21 NOTE — Telephone Encounter (Signed)
Call transferred from front desk-   Pt wanted to confirm we will continue to refill her htn med after her AE. She prefers to have a one stop shop.   Spoke with JML and confirmed that she will need a pcp to refill her HTN med. Pt aware. For now she will keep her appt but may decide to see 1 provider who could refill all her meds. Advised pt that I understand.   Pt also wanted to let me know that she did not appreciate that I informed her we do not tolerate vuglar language on the phone. She wanted me to apologize. She can speak however she wishes. If she did curse it was not directed at anyone.  She is the patient and she has the right to do that.  She advised me that if she was using vulgar language that the girl who answered should have addressed at the time. 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 I was not on the phone and I did not know what she said. I am not her Mother and had no right to ask her to not use vulgar language.   I apologized to the patient for making her feel uncomfortable . I again made it clear that [facility] does not tolerate vulgar language on the phone or in the clinic.   I advised pt that if we could help her in anyway to let us know.

## 2019-09-21 NOTE — Telephone Encounter (Signed)
Pt aware JML is ok to give enough bp med until AE. Pt states she also gets VIT d.   Aware that JML will draw vit d lab at visit. She may take vit 2000 OTC until them. Pt ok with that plan.   Informed pt that TC states she was using vulvar language on the phone.  Made pt aware that cursing at front desk was not appropriate and will not be tolerated.   She states she did not curse at the staff but was only cursing in general. She did apologize.   She states she only needed her BP med refilled. She acknowledges that she was agitated.   Med refilled.

## 2019-10-18 ENCOUNTER — Other Ambulatory Visit: Payer: Self-pay

## 2019-10-18 ENCOUNTER — Telehealth: Payer: Self-pay | Admitting: Gastroenterology

## 2019-10-18 DIAGNOSIS — Z1211 Encounter for screening for malignant neoplasm of colon: Secondary | ICD-10-CM

## 2019-10-18 NOTE — Telephone Encounter (Signed)
Patient called and is wanting to get setup for a colonoscopy. Patient said she could be reached at  [phone].

## 2019-10-19 ENCOUNTER — Other Ambulatory Visit: Payer: Self-pay

## 2019-10-19 DIAGNOSIS — Z1211 Encounter for screening for malignant neoplasm of colon: Secondary | ICD-10-CM

## 2019-10-19 MED ORDER — SUPREP BOWEL PREP KIT 17.5-3.13-1.6 GM/177ML PO SOLN: 1.0000 | ORAL | 0 refills | Status: AC

## 2019-10-19 NOTE — Telephone Encounter (Signed)
Returned pt's call and scheduled a colonoscopy on 11/05/19. Paperwork submitted through [employer] and bowel prep rx sent to [facility], [facility]

## 2019-10-29 ENCOUNTER — Encounter: Payer: BLUE CROSS/BLUE SHIELD | Admitting: Certified Nurse Midwife

## 2019-11-08 ENCOUNTER — Other Ambulatory Visit: Payer: Self-pay

## 2019-11-08 ENCOUNTER — Encounter: Payer: Self-pay | Admitting: Gastroenterology

## 2019-11-14 ENCOUNTER — Other Ambulatory Visit: Admission: RE | Admit: 2019-11-14 | Payer: BC Managed Care – PPO | Source: Ambulatory Visit

## 2019-11-14 NOTE — Discharge Instructions (Signed)
General Anesthesia, Adult, Care After This sheet gives you information about how to care for yourself after your procedure. Your health care provider may also give you more specific instructions. If you have problems or questions, contact your health care provider. What can I expect after the procedure? After the procedure, the following side effects are common:  Pain or discomfort at the IV site.  Nausea.  Vomiting.  Sore throat.  Trouble concentrating.  Feeling cold or chills.  Weak or tired.  Sleepiness and fatigue.  Soreness and body aches. These side effects can affect parts of the body that were not involved in surgery. Follow these instructions at home:  For at least 24 hours after the procedure:  Have a responsible adult stay with you. It is important to have someone help care for you until you are awake and alert.  Rest as needed.  Do not: ? Participate in activities in which you could fall or become injured. ? Drive. ? Use heavy machinery. ? Drink alcohol. ? Take sleeping pills or medicines that cause drowsiness. ? Make important decisions or sign legal documents. ? Take care of children on your own. Eating and drinking  Follow any instructions from your health care provider about eating or drinking restrictions.  When you feel hungry, start by eating small amounts of foods that are soft and easy to digest (bland), such as toast. Gradually return to your regular diet.  Drink enough fluid to keep your urine pale yellow.  If you vomit, rehydrate by drinking water, juice, or clear broth. General instructions  If you have sleep apnea, surgery and certain medicines can increase your risk for breathing problems. Follow instructions from your health care provider about wearing your sleep device: ? Anytime you are sleeping, including during daytime naps. ? While taking prescription pain medicines, sleeping medicines, or medicines that make you drowsy.  Return to  your normal activities as told by your health care provider. Ask your health care provider what activities are safe for you.  Take over-the-counter and prescription medicines only as told by your health care provider.  If you smoke, do not smoke without supervision.  Keep all follow-up visits as told by your health care provider. This is important. Contact a health care provider if:  You have nausea or vomiting that does not get better with medicine.  You cannot eat or drink without vomiting.  You have pain that does not get better with medicine.  You are unable to pass urine.  You develop a skin rash.  You have a fever.  You have redness around your IV site that gets worse. Get help right away if:  You have difficulty breathing.  You have chest pain.  You have blood in your urine or stool, or you vomit blood. Summary  After the procedure, it is common to have a sore throat or nausea. It is also common to feel tired.  Have a responsible adult stay with you for the first 24 hours after general anesthesia. It is important to have someone help care for you until you are awake and alert.  When you feel hungry, start by eating small amounts of foods that are soft and easy to digest (bland), such as toast. Gradually return to your regular diet.  Drink enough fluid to keep your urine pale yellow.  Return to your normal activities as told by your health care provider. Ask your health care provider what activities are safe for you. This information is not   intended to replace advice given to you by your health care provider. Make sure you discuss any questions you have with your health care provider. Document Revised: 06/03/2017 Document Reviewed: 01/14/2017 Elsevier Patient Education  2020 [employer]  

## 2019-11-16 ENCOUNTER — Ambulatory Visit: Payer: BC Managed Care – PPO | Admitting: Anesthesiology

## 2019-11-16 ENCOUNTER — Other Ambulatory Visit: Payer: Self-pay

## 2019-11-16 ENCOUNTER — Ambulatory Visit
Admission: RE | Admit: 2019-11-16 | Discharge: 2019-11-16 | Disposition: A | Discharge status: Home / Self Care | Payer: BC Managed Care – PPO | Attending: Gastroenterology | Admitting: Gastroenterology

## 2019-11-16 ENCOUNTER — Encounter: Payer: Self-pay | Admitting: Gastroenterology

## 2019-11-16 ENCOUNTER — Encounter
Admission: RE | Disposition: A | Discharge status: Home / Self Care | Payer: Self-pay | Source: Home / Self Care | Attending: Gastroenterology

## 2019-11-16 DIAGNOSIS — Z8249 Family history of ischemic heart disease and other diseases of the circulatory system: Secondary | ICD-10-CM | POA: Diagnosis not present

## 2019-11-16 DIAGNOSIS — Z6828 Body mass index (BMI) 28.0-28.9, adult: Secondary | ICD-10-CM | POA: Insufficient documentation

## 2019-11-16 DIAGNOSIS — D128 Benign neoplasm of rectum: Secondary | ICD-10-CM | POA: Diagnosis not present

## 2019-11-16 DIAGNOSIS — Z79899 Other long term (current) drug therapy: Secondary | ICD-10-CM | POA: Insufficient documentation

## 2019-11-16 DIAGNOSIS — K635 Polyp of colon: Secondary | ICD-10-CM | POA: Diagnosis not present

## 2019-11-16 DIAGNOSIS — E669 Obesity, unspecified: Secondary | ICD-10-CM | POA: Insufficient documentation

## 2019-11-16 DIAGNOSIS — I10 Essential (primary) hypertension: Secondary | ICD-10-CM | POA: Diagnosis not present

## 2019-11-16 DIAGNOSIS — K621 Rectal polyp: Secondary | ICD-10-CM | POA: Diagnosis not present

## 2019-11-16 DIAGNOSIS — Z1211 Encounter for screening for malignant neoplasm of colon: Secondary | ICD-10-CM | POA: Diagnosis not present

## 2019-11-16 DIAGNOSIS — Z791 Long term (current) use of non-steroidal anti-inflammatories (NSAID): Secondary | ICD-10-CM | POA: Insufficient documentation

## 2019-11-16 DIAGNOSIS — D124 Benign neoplasm of descending colon: Secondary | ICD-10-CM | POA: Diagnosis not present

## 2019-11-16 HISTORY — PX: POLYPECTOMY: SHX5525

## 2019-11-16 HISTORY — DX: Nausea with vomiting, unspecified: R11.2

## 2019-11-16 HISTORY — DX: Other specified postprocedural states: Z98.890

## 2019-11-16 HISTORY — PX: COLONOSCOPY WITH PROPOFOL: SHX5780

## 2019-11-16 HISTORY — DX: Presence of spectacles and contact lenses: Z97.3

## 2019-11-16 HISTORY — DX: Motion sickness, initial encounter: T75.3XXA

## 2019-11-16 LAB — POCT PREGNANCY, URINE: Preg Test, Ur: NEGATIVE

## 2019-11-16 SURGERY — COLONOSCOPY WITH PROPOFOL
Anesthesia: General | Site: Rectum

## 2019-11-16 MED ORDER — PROPOFOL 10 MG/ML IV BOLUS
INTRAVENOUS | Status: DC | PRN
  Administered 2019-11-16: 30 mg via INTRAVENOUS
  Administered 2019-11-16: 20 mg via INTRAVENOUS
  Administered 2019-11-16: 80 mg via INTRAVENOUS
  Administered 2019-11-16: 30 mg via INTRAVENOUS
  Administered 2019-11-16 (×2): 20 mg via INTRAVENOUS
  Administered 2019-11-16: 40 mg via INTRAVENOUS

## 2019-11-16 MED ORDER — SODIUM CHLORIDE 0.9 % IV SOLN: INTRAVENOUS | Status: DC

## 2019-11-16 MED ORDER — ACETAMINOPHEN 160 MG/5ML PO SOLN: 325.0000 mg | Freq: Once | ORAL | Status: DC

## 2019-11-16 MED ORDER — ACETAMINOPHEN 325 MG PO TABS: 325.0000 mg | ORAL_TABLET | Freq: Once | ORAL | Status: DC

## 2019-11-16 MED ORDER — LACTATED RINGERS IV SOLN
10.0000 mL/h | INTRAVENOUS | Status: DC
  Administered 2019-11-16: 10 mL/h via INTRAVENOUS

## 2019-11-16 MED ORDER — LIDOCAINE HCL (CARDIAC) PF 100 MG/5ML IV SOSY
PREFILLED_SYRINGE | INTRAVENOUS | Status: DC | PRN
  Administered 2019-11-16: 40 mg via INTRAVENOUS

## 2019-11-16 MED ORDER — STERILE WATER FOR IRRIGATION IR SOLN
Status: DC | PRN
  Administered 2019-11-16: 50 mL

## 2019-11-16 SURGICAL SUPPLY — 6 items
FORCEPS BIOP RAD 4 LRG CAP 4 (CUTTING FORCEPS) ×2 IMPLANT
GOWN CVR UNV OPN BCK APRN NK (MISCELLANEOUS) ×2 IMPLANT
GOWN ISOL THUMB LOOP REG UNIV (MISCELLANEOUS) ×2
KIT ENDO PROCEDURE OLY (KITS) ×2 IMPLANT
MANIFOLD NEPTUNE II (INSTRUMENTS) ×2 IMPLANT
WATER STERILE IRR 250ML POUR (IV SOLUTION) ×2 IMPLANT

## 2019-11-16 NOTE — Anesthesia Procedure Notes (Signed)
Performed by: Raphel Stickles, CRNA Pre-anesthesia Checklist: Patient identified, Emergency Drugs available, Suction available, Timeout performed and Patient being monitored Patient Re-evaluated:Patient Re-evaluated prior to induction Oxygen Delivery Method: Nasal cannula Placement Confirmation: positive ETCO2       

## 2019-11-16 NOTE — Op Note (Signed)
[facility]astroenter[facility] Patient Name: Carla Oliver Procedure Date: 11/16/2019 9:51 AM MRN: [id number] Account #: [id number] Date of Birth: 1971/03/05 Admit Type: Outpatient Age: 49 Room: [facility] OR ROOM 01 Gender: Female Note Status: Finalized Procedure:             Colonoscopy Indications:           Screening for colorectal malignant neoplasm Providers:             Lucilla Lame MD, MD Referring MD:          No Local Md, MD (Referring MD) Medicines:             Propofol per Anesthesia Complications:         No immediate complications. Procedure:             Pre-Anesthesia Assessment:                        - Prior to the procedure, a History and Physical was                         performed, and patient medications and allergies were                         reviewed. The patient's tolerance of previous                         anesthesia was also reviewed. The risks and benefits                         of the procedure and the sedation options and risks                         were discussed with the patient. All questions were                         answered, and informed consent was obtained. Prior                         Anticoagulants: The patient has taken no previous                         anticoagulant or antiplatelet agents. ASA Grade                         Assessment: II - A patient with mild systemic disease.                         After reviewing the risks and benefits, the patient                         was deemed in satisfactory condition to undergo the                         procedure.                        After obtaining informed consent, the colonoscope was  passed under direct vision. Throughout the procedure,                         the patient's blood pressure, pulse, and oxygen                         saturations were monitored continuously. The was                         introduced through the anus and  advanced to the the                         cecum, identified by appendiceal orifice and ileocecal                         valve. The colonoscopy was performed without                         difficulty. The patient tolerated the procedure well.                         The quality of the bowel preparation was excellent. Findings:      The perianal and digital rectal examinations were normal.      A 3 mm polyp was found in the descending colon. The polyp was sessile.       The polyp was removed with a cold biopsy forceps. Resection and       retrieval were complete.      A 2 mm polyp was found in the rectum. The polyp was sessile. The polyp       was removed with a cold biopsy forceps. Resection and retrieval were       complete. Impression:            - One 3 mm polyp in the descending colon, removed with                         a cold biopsy forceps. Resected and retrieved.                        - One 2 mm polyp in the rectum, removed with a cold                         biopsy forceps. Resected and retrieved. Recommendation:        - Discharge patient to home.                        - Resume previous diet.                        - Continue present medications.                        - Await pathology results.                        - Repeat colonoscopy in 5 years if polyp adenoma and                         10 years  if hyperplastic Procedure Code(s):     --- Professional ---                        [id number], Colonoscopy, flexible; with biopsy, single or                         multiple Diagnosis Code(s):     --- Professional ---                        Z12.11, Encounter for screening for malignant neoplasm                         of colon                        K63.5, Polyp of colon                        K62.1, Rectal polyp CPT copyright 2019 American [employer] Association. All rights reserved. The codes documented in this report are preliminary and upon coder review may  be revised to  meet current compliance requirements. Lucilla Lame MD, MD 11/16/2019 10:41:34 AM This report has been signed electronically. Number of Addenda: 0 Note Initiated On: 11/16/2019 9:51 AM Scope Withdrawal Time: 0 hours 8 minutes 16 seconds  Total Procedure Duration: 0 hours 11 minutes 6 seconds  Estimated Blood Loss:  Estimated blood loss: none.      [facility]

## 2019-11-16 NOTE — H&P (Signed)
Carla Lame, MD [address] [place], [place] [address] Phone: [phone] Fax : [phone]  Primary Care Physician:  Patient, No Pcp Per Primary Gastroenterologist:  Dr. Allen Norris  Pre-Procedure History & Physical: HPI:  Carla Oliver is a 49 y.o. female is here for a screening colonoscopy.   Past Medical History:  Diagnosis Date  . ADD (attention deficit disorder)   . Hyperlipidemia   . Hypertension   . Motion sickness    cars  . Obesity   . PONV (postoperative nausea and vomiting)   . Sleep disturbance   . Wears contact lenses     Past Surgical History:  Procedure Laterality Date  . DILATION AND CURETTAGE OF UTERUS  2001    Prior to Admission medications   Medication Sig Start Date End Date Taking? Authorizing Provider  lisinopril (ZESTRIL) 20 MG tablet TAKE 1 TABLET(20 MG) BY MOUTH DAILY 09/21/19  Yes Lawhorn, Lara Mulch, CNM  meloxicam (MOBIC) 15 MG tablet Take 15 mg by mouth daily.   Yes [provider]  Na Sulfate-K Sulfate-Mg Sulf (SUPREP BOWEL PREP KIT) 17.5-3.13-1.6 GM/177ML SOLN Take 1 kit by mouth as directed. 10/19/19  Yes Carla Lame, MD  VITAMIN D PO Take by mouth daily.   Yes [provider]  Vitamin D, Ergocalciferol, (DRISDOL) 1.25 MG (50000 UT) CAPS capsule TAKE 1 CAPSULE BY MOUTH WEEKLY Patient not taking: Reported on 11/08/2019 05/15/18   Joylene Igo, CNM    Allergies as of 10/18/2019  . (No Known Allergies)    Family History  Problem Relation Age of Onset  . Cancer Mother        lung- non smoker  . Hypertension Mother   . Hypertension Father     Social History   Socioeconomic History  . Marital status: Married    Spouse name: Not on file  . Number of children: Not on file  . Years of education: Not on file  . Highest education level: Not on file  Occupational History  . Not on file  Tobacco Use  . Smoking status: Never Smoker  . Smokeless tobacco: Never Used  Substance and Sexual Activity    . Alcohol use: Yes    Alcohol/week: 1.0 standard drinks    Types: 1 Standard drinks or equivalent per week    Comment: social  . Drug use: No  . Sexual activity: Yes    Birth control/protection: Condom  Other Topics Concern  . Not on file  Social History Narrative  . Not on file   Social Determinants of Health   Financial Resource Strain:   . Difficulty of Paying Living Expenses:   Food Insecurity:   . Worried About Charity fundraiser in the Last Year:   . Arboriculturist in the Last Year:   Transportation Needs:   . Film/video editor (Medical):   Marland Kitchen Lack of Transportation (Non-Medical):   Physical Activity:   . Days of Exercise per Week:   . Minutes of Exercise per Session:   Stress:   . Feeling of Stress :   Social Connections:   . Frequency of Communication with Friends and Family:   . Frequency of Social Gatherings with Friends and Family:   . Attends Religious Services:   . Active Member of Clubs or Organizations:   . Attends Archivist Meetings:   Marland Kitchen Marital Status:   Intimate Partner Violence:   . Fear of Current or Ex-Partner:   . Emotionally  Abused:   Marland Kitchen Physically Abused:   . Sexually Abused:     Review of Systems: See HPI, otherwise negative ROS  Physical Exam: BP 139/82   Pulse 80   Temp 97.7 F (36.5 C) (Temporal)   Resp 16   Ht 5' 6"  (1.676 m)   Wt 79.8 kg   LMP 10/24/2019 (Approximate)   SpO2 100%   BMI 28.41 kg/m  General:   Alert,  pleasant and cooperative in NAD Head:  Normocephalic and atraumatic. Neck:  Supple; no masses or thyromegaly. Lungs:  Clear throughout to auscultation.    Heart:  Regular rate and rhythm. Abdomen:  Soft, nontender and nondistended. Normal bowel sounds, without guarding, and without rebound.   Neurologic:  Alert and  oriented x4;  grossly normal neurologically.  Impression/Plan: Carla Oliver is now here to undergo a screening colonoscopy.  Risks, benefits, and alternatives regarding  colonoscopy have been reviewed with the patient.  Questions have been answered.  All parties agreeable.

## 2019-11-16 NOTE — Transfer of Care (Signed)
Immediate Anesthesia Transfer of Care Note  Patient: Carla Oliver  Procedure(s) Performed: COLONOSCOPY WITH BIOPSY (N/A Rectum) POLYPECTOMY (N/A Rectum)  Patient Location: PACU  Anesthesia Type: General  Level of Consciousness: awake, alert  and patient cooperative  Airway and Oxygen Therapy: Patient Spontanous Breathing and Patient connected to supplemental oxygen  Post-op Assessment: Post-op Vital signs reviewed, Patient's Cardiovascular Status Stable, Respiratory Function Stable, Patent Airway and No signs of Nausea or vomiting  Post-op Vital Signs: Reviewed and stable  Complications: No apparent anesthesia complications

## 2019-11-16 NOTE — Anesthesia Postprocedure Evaluation (Signed)
Anesthesia Post Note  Patient: Carla Oliver  Procedure(s) Performed: COLONOSCOPY WITH BIOPSY (N/A Rectum) POLYPECTOMY (N/A Rectum)     Patient location during evaluation: PACU Anesthesia Type: General Level of consciousness: awake and alert and oriented Pain management: satisfactory to patient Vital Signs Assessment: post-procedure vital signs reviewed and stable Respiratory status: spontaneous breathing, nonlabored ventilation and respiratory function stable Cardiovascular status: blood pressure returned to baseline and stable Postop Assessment: Adequate PO intake and No signs of nausea or vomiting Anesthetic complications: no    Raliegh Ip

## 2019-11-16 NOTE — Anesthesia Preprocedure Evaluation (Signed)
Anesthesia Evaluation  Patient identified by MRN, date of birth, ID band Patient awake    Reviewed: Allergy & Precautions, H&P , NPO status , Patient's Chart, lab work & pertinent test results  History of Anesthesia Complications (+) PONV and history of anesthetic complications  Airway Mallampati: II  TM Distance: >3 FB Neck ROM: full    Dental no notable dental hx.    Pulmonary    Pulmonary exam normal breath sounds clear to auscultation       Cardiovascular hypertension, Normal cardiovascular exam Rhythm:regular Rate:Normal     Neuro/Psych PSYCHIATRIC DISORDERS    GI/Hepatic   Endo/Other    Renal/GU      Musculoskeletal   Abdominal   Peds  Hematology   Anesthesia Other Findings   Reproductive/Obstetrics                             Anesthesia Physical Anesthesia Plan  ASA: II  Anesthesia Plan: General   Post-op Pain Management:    Induction: Intravenous  PONV Risk Score and Plan: 2 and Treatment may vary due to age or medical condition, TIVA and Propofol infusion  Airway Management Planned: Natural Airway  Additional Equipment:   Intra-op Plan:   Post-operative Plan:   Informed Consent: I have reviewed the patients History and Physical, chart, labs and discussed the procedure including the risks, benefits and alternatives for the proposed anesthesia with the patient or authorized representative who has indicated his/her understanding and acceptance.     Dental Advisory Given  Plan Discussed with: CRNA  Anesthesia Plan Comments:         Anesthesia Quick Evaluation

## 2019-11-19 ENCOUNTER — Encounter: Payer: Self-pay | Admitting: Gastroenterology

## 2019-11-19 LAB — SURGICAL PATHOLOGY

## 2019-11-24 ENCOUNTER — Other Ambulatory Visit: Payer: Self-pay | Admitting: Certified Nurse Midwife

## 2024-01-31 ENCOUNTER — Other Ambulatory Visit: Payer: Self-pay | Admitting: Family

## 2024-01-31 DIAGNOSIS — M79661 Pain in right lower leg: Secondary | ICD-10-CM

## 2024-02-01 ENCOUNTER — Inpatient Hospital Stay
Admission: RE | Admit: 2024-02-01 | Discharge: 2024-02-01 | Discharge status: Home / Self Care | Source: Ambulatory Visit | Attending: Family

## 2024-02-01 DIAGNOSIS — M79661 Pain in right lower leg: Secondary | ICD-10-CM

## 2024-05-01 ENCOUNTER — Emergency Department: Payer: Self-pay

## 2024-05-01 ENCOUNTER — Other Ambulatory Visit: Payer: Self-pay

## 2024-05-01 ENCOUNTER — Emergency Department
Admission: EM | Admit: 2024-05-01 | Discharge: 2024-05-01 | Disposition: A | Discharge status: Home / Self Care | Payer: Self-pay | Attending: Emergency Medicine | Admitting: Emergency Medicine

## 2024-05-01 DIAGNOSIS — R42 Dizziness and giddiness: Secondary | ICD-10-CM | POA: Insufficient documentation

## 2024-05-01 DIAGNOSIS — I10 Essential (primary) hypertension: Secondary | ICD-10-CM | POA: Insufficient documentation

## 2024-05-01 LAB — BASIC METABOLIC PANEL WITH GFR
Anion gap: 12 (ref 5–15)
BUN: 11 mg/dL (ref 6–20)
CO2: 24 mmol/L (ref 22–32)
Calcium: 9.5 mg/dL (ref 8.9–10.3)
Chloride: 103 mmol/L (ref 98–111)
Creatinine, Ser: 0.62 mg/dL (ref 0.44–1.00)
GFR, Estimated: >60 mL/min (ref >60)
Glucose, Bld: 143 mg/dL — ABNORMAL HIGH (ref 70–99)
Potassium: 4.1 mmol/L (ref 3.5–5.1)
Sodium: 139 mmol/L (ref 135–145)

## 2024-05-01 LAB — CBC
HCT: 47.3 % — ABNORMAL HIGH (ref 36.0–46.0)
Hemoglobin: 15.5 g/dL — ABNORMAL HIGH (ref 12.0–15.0)
MCH: 28.5 pg (ref 26.0–34.0)
MCHC: 32.8 g/dL (ref 30.0–36.0)
MCV: 87.1 fL (ref 80.0–100.0)
Platelets: 277 K/uL (ref 150–400)
RBC: 5.43 MIL/uL — ABNORMAL HIGH (ref 3.87–5.11)
RDW: 12.4 % (ref 11.5–15.5)
WBC: 6.1 K/uL (ref 4.0–10.5)
nRBC: 0 % (ref 0.0–0.2)

## 2024-05-01 MED ORDER — ONDANSETRON HCL 4 MG/2ML IJ SOLN
4.0000 mg | Freq: Once | INTRAMUSCULAR | Status: AC
  Administered 2024-05-01: 4 mg via INTRAVENOUS
  Filled 2024-05-01: qty 2

## 2024-05-01 MED ORDER — LACTATED RINGERS IV BOLUS
1000.0000 mL | Freq: Once | INTRAVENOUS | Status: AC
  Administered 2024-05-01: 1000 mL via INTRAVENOUS

## 2024-05-01 MED ORDER — MECLIZINE HCL 25 MG PO TABS
25.0000 mg | ORAL_TABLET | Freq: Once | ORAL | Status: AC
  Administered 2024-05-01: 25 mg via ORAL
  Filled 2024-05-01: qty 1

## 2024-05-01 NOTE — Discharge Instructions (Signed)
 You are seen in the emergency department for vertigo.  You had an MRI of your brain that did not show any signs of a stroke.  Concerned that you have peripheral vertigo.  You were given information of how to perform Epley maneuvers, perform in the morning, afternoon and nighttime.  You can take meclizine every 8 hours as needed for severe dizziness.  Follow-up closely with your primary care physician.  You are given information to follow-up with the ear nose and throat specialist for possible vestibular therapy.  Return to the emergency department for any ongoing or worsening symptoms.

## 2024-05-01 NOTE — ED Triage Notes (Signed)
 Pt to ED from doctors for hypertension, dizziness, blurred vision. Hx HTN, took lisinopril  this am. SBP>200. Denies cp

## 2024-05-01 NOTE — ED Provider Notes (Addendum)
 [facility] Provider Note    Event Date/Time   First MD Initiated Contact with Patient 05/01/24 [phone]     (approximate)   History   Hypertension and Dizziness   HPI  Carla Oliver is a 53 y.o. female past medical history significant for hypertension, who presents to the emergency department with high blood pressure and dizziness.  Patient states that she previously had a history of high blood pressure but she has been taking off of all of her medications.  Normal state of health last night when she went to bed.  This morning when she went to wake up had a sudden onset of vision changes and felt like the room was moving in a continuous repetitive pattern.  Lasted for approximately 1 minute.  Then states that she had significant dizziness that felt worse whenever she went to lay down or stand up.  Does have a history of vertigo in the past where she had to have vestibular therapy for.  States that it felt similar in the sense of previously would be worse with head movement but has never had vision changes in the past.  Took a home meclizine.  States that she checked her blood pressure at home and it was elevated at 175 so she took an old lisinopril  medication.  She then had her husband drive her to her primary care physician office.  States that she had multiple blood pressures that were elevated at her primary care physician office and they told her she needed to come to the emergency department.  States that she has a history of significantly high blood pressure anytime she gets anxious or nervous and was feeling very anxious and nervous.  States that her symptoms of vertigo had significantly improved by the time she arrived to her primary care physician.  No recent falls or head trauma.  Does state she has ongoing symptoms now when she moves her head.  No ringing or ears.  No change of hearing.  No chest pain or shortness of breath.     Physical Exam   Triage Vital  Signs: ED Triage Vitals  Encounter Vitals Group     BP 05/01/24 0939 (!) 217/101     Girls Systolic BP Percentile --      Girls Diastolic BP Percentile --      Boys Systolic BP Percentile --      Boys Diastolic BP Percentile --      Pulse Rate 05/01/24 0938 80     Resp 05/01/24 0938 18     Temp 05/01/24 0938 97.9 F (36.6 C)     Temp src --      SpO2 05/01/24 0938 100 %     Weight 05/01/24 0937 200 lb (90.7 kg)     Height 05/01/24 0937 5' 6 (1.676 m)     Head Circumference --      Peak Flow --      Pain Score 05/01/24 0937 0     Pain Loc --      Pain Education --      Exclude from Growth Chart --     Most recent vital signs: Vitals:   05/01/24 1230 05/01/24 1421  BP: (!) 157/80 (!) 158/95  Pulse: 68 74  Resp: 15   Temp:    SpO2: 97% 100%    Physical Exam Constitutional:      Appearance: She is well-developed.  HENT:     Head: Atraumatic.  Eyes:  Extraocular Movements:     Right eye: No nystagmus.     Left eye: No nystagmus.     Conjunctiva/sclera: Conjunctivae normal.     Comments: Positive Dix-Hallpike maneuver to the right side  Cardiovascular:     Rate and Rhythm: Regular rhythm.  Pulmonary:     Effort: No respiratory distress.  Abdominal:     General: There is no distension.  Musculoskeletal:        General: Normal range of motion.     Cervical back: Normal range of motion.  Skin:    General: Skin is warm.  Neurological:     Mental Status: She is alert. Mental status is at baseline.     GCS: GCS eye subscore is 4. GCS verbal subscore is 5. GCS motor subscore is 6.     Cranial Nerves: Cranial nerves 2-12 are intact.     Sensory: Sensation is intact.     Motor: Motor function is intact.     Coordination: Coordination is intact.     IMPRESSION / MDM / ASSESSMENT AND PLAN / ED COURSE  I reviewed the triage vital signs and the nursing notes.  Differential diagnosis including hypertensive emergency, intracranial hemorrhage, demyelinating  disorder, peripheral vertigo, electrolyte abnormality  Patient's initially blood pressure was 217/101, repeat blood pressure at rest in the emergency department was 143/62.  Patient currently has a nonfocal neurologic exam and not consistent with a CVA.  EKG  I, Clotilda Punter, the attending physician, personally viewed and interpreted this ECG.  EKG showed normal sinus rhythm.  Nonspecific ST changes.  Significant artifact.  No significant ST elevation or depression.  No findings of acute ischemia or dysrhythmia.  No tachycardic or bradycardic dysrhythmias while on cardiac telemetry.  RADIOLOGY I independently reviewed imaging, my interpretation of imaging: CT scan of the head -no signs of infarct no signs of intracranial hemorrhage  MRI of the brain showed no acute findings  LABS (all labs ordered are listed, but only abnormal results are displayed) Labs interpreted as -    Labs Reviewed  CBC - Abnormal; Notable for the following components:      Result Value   RBC 5.43 (*)    Hemoglobin 15.5 (*)    HCT 47.3 (*)    All other components within normal limits  BASIC METABOLIC PANEL WITH GFR - Abnormal; Notable for the following components:   Glucose, Bld 143 (*)    All other components within normal limits     MDM  Clinical picture on my exam is most consistent with a peripheral vertigo. Lab work overall reassuring.   Clinical Course as of 05/01/24 1500  Tue May 01, 2024  1149 Patient states when she went to get up she had ongoing and worsening dizziness and nausea.  Given her ongoing symptoms we will obtain an MRI to further evaluate for possible central cause of her vertigo.  If negative patient states that she would want to go home and follow-up as an outpatient with ear nose and throat and discuss further vestibular therapy. [SM]  1442 MRI with no findings of acute CVA.  On reevaluation states that she is feeling better.  Most likely with peripheral vertigo.  Discussed  meclizine, Epley maneuver and outpatient follow-up with ENT for possible vestibular therapy.  Follow-up closely with her primary care physician.  Discussed return precautions for any ongoing or worsening symptoms.  Blood pressure significantly improved while in the emergency department.  States that she will follow-up with her  primary care physician for blood pressure repeat. [SM]    Clinical Course User Index [SM] Suzanne Kirsch, MD     PROCEDURES:  Critical Care performed: No  Procedures  Patient's presentation is most consistent with acute presentation with potential threat to life or bodily function.   MEDICATIONS ORDERED IN ED: Medications  meclizine (ANTIVERT) tablet 25 mg (25 mg Oral Given 05/01/24 1027)  lactated ringers  bolus 1,000 mL (0 mLs Intravenous Stopped 05/01/24 1146)  ondansetron  (ZOFRAN ) injection 4 mg (4 mg Intravenous Given 05/01/24 1157)    FINAL CLINICAL IMPRESSION(S) / ED DIAGNOSES   Final diagnoses:  Dizziness  Vertigo     Rx / DC Orders   ED Discharge Orders     None        Note:  This document was prepared using Dragon voice recognition software and may include unintentional dictation errors.   Suzanne Kirsch, MD 05/01/24 1442    Suzanne Kirsch, MD 05/01/24 1500
